# Patient Record
Sex: Female | Born: 1945
Health system: Southern US, Community
[De-identification: ages and names within clinical notes are randomized; demographics above are authoritative.]

## PROBLEM LIST (undated history)

## (undated) DIAGNOSIS — K219 Gastro-esophageal reflux disease without esophagitis: Secondary | ICD-10-CM

## (undated) DIAGNOSIS — H409 Unspecified glaucoma: Secondary | ICD-10-CM

## (undated) DIAGNOSIS — C801 Malignant (primary) neoplasm, unspecified: Secondary | ICD-10-CM

## (undated) DIAGNOSIS — D72820 Lymphocytosis (symptomatic): Secondary | ICD-10-CM

## (undated) DIAGNOSIS — F329 Major depressive disorder, single episode, unspecified: Secondary | ICD-10-CM

## (undated) DIAGNOSIS — F32A Depression, unspecified: Secondary | ICD-10-CM

## (undated) DIAGNOSIS — M792 Neuralgia and neuritis, unspecified: Secondary | ICD-10-CM

## (undated) DIAGNOSIS — C9110 Chronic lymphocytic leukemia of B-cell type not having achieved remission: Secondary | ICD-10-CM

## (undated) DIAGNOSIS — F419 Anxiety disorder, unspecified: Secondary | ICD-10-CM

## (undated) DIAGNOSIS — I10 Essential (primary) hypertension: Secondary | ICD-10-CM

## (undated) HISTORY — PX: GLAUCOMA SURGERY: SHX656

## (undated) HISTORY — PX: BREAST SURGERY: SHX581

---

## 2017-07-20 ENCOUNTER — Other Ambulatory Visit: Payer: Self-pay | Admitting: Orthopedic Surgery

## 2017-08-04 ENCOUNTER — Encounter (HOSPITAL_BASED_OUTPATIENT_CLINIC_OR_DEPARTMENT_OTHER): Payer: Self-pay | Admitting: *Deleted

## 2017-08-05 ENCOUNTER — Encounter (HOSPITAL_BASED_OUTPATIENT_CLINIC_OR_DEPARTMENT_OTHER)
Admission: RE | Admit: 2017-08-05 | Discharge: 2017-08-05 | Disposition: A | Discharge status: Home / Self Care | Payer: Self-pay | Source: Ambulatory Visit | Attending: Orthopedic Surgery | Admitting: Orthopedic Surgery

## 2017-08-05 DIAGNOSIS — Z888 Allergy status to other drugs, medicaments and biological substances status: Secondary | ICD-10-CM | POA: Diagnosis not present

## 2017-08-05 DIAGNOSIS — K219 Gastro-esophageal reflux disease without esophagitis: Secondary | ICD-10-CM | POA: Diagnosis not present

## 2017-08-05 DIAGNOSIS — F419 Anxiety disorder, unspecified: Secondary | ICD-10-CM | POA: Diagnosis not present

## 2017-08-05 DIAGNOSIS — Z853 Personal history of malignant neoplasm of breast: Secondary | ICD-10-CM | POA: Diagnosis not present

## 2017-08-05 DIAGNOSIS — M2021 Hallux rigidus, right foot: Secondary | ICD-10-CM | POA: Diagnosis present

## 2017-08-05 DIAGNOSIS — H409 Unspecified glaucoma: Secondary | ICD-10-CM | POA: Diagnosis not present

## 2017-08-05 DIAGNOSIS — M2031 Hallux varus (acquired), right foot: Secondary | ICD-10-CM | POA: Diagnosis not present

## 2017-08-05 DIAGNOSIS — I10 Essential (primary) hypertension: Secondary | ICD-10-CM | POA: Diagnosis not present

## 2017-08-05 DIAGNOSIS — Z79899 Other long term (current) drug therapy: Secondary | ICD-10-CM | POA: Diagnosis not present

## 2017-08-05 DIAGNOSIS — F329 Major depressive disorder, single episode, unspecified: Secondary | ICD-10-CM | POA: Diagnosis not present

## 2017-08-05 DIAGNOSIS — Z856 Personal history of leukemia: Secondary | ICD-10-CM | POA: Diagnosis not present

## 2017-08-05 DIAGNOSIS — M21621 Bunionette of right foot: Secondary | ICD-10-CM | POA: Diagnosis not present

## 2017-08-05 LAB — BASIC METABOLIC PANEL
ANION GAP: 8 (ref 5–15)
BUN: 9 mg/dL (ref 6–20)
CHLORIDE: 98 mmol/L — AB (ref 101–111)
CO2: 28 mmol/L (ref 22–32)
CREATININE: 0.67 mg/dL (ref 0.44–1.00)
Calcium: 10 mg/dL (ref 8.9–10.3)
GFR calc non Af Amer: >60 mL/min (ref >60)
GLUCOSE: 108 mg/dL — AB (ref 65–99)
Potassium: 4.1 mmol/L (ref 3.5–5.1)
Sodium: 134 mmol/L — ABNORMAL LOW (ref 135–145)

## 2017-08-11 ENCOUNTER — Ambulatory Visit (HOSPITAL_BASED_OUTPATIENT_CLINIC_OR_DEPARTMENT_OTHER): Payer: Medicare Other | Admitting: Certified Registered"

## 2017-08-11 ENCOUNTER — Encounter (HOSPITAL_BASED_OUTPATIENT_CLINIC_OR_DEPARTMENT_OTHER)
Admission: RE | Disposition: A | Discharge status: Home / Self Care | Payer: Self-pay | Source: Ambulatory Visit | Attending: Orthopedic Surgery

## 2017-08-11 ENCOUNTER — Encounter (HOSPITAL_BASED_OUTPATIENT_CLINIC_OR_DEPARTMENT_OTHER): Payer: Self-pay | Admitting: Certified Registered"

## 2017-08-11 ENCOUNTER — Ambulatory Visit (HOSPITAL_BASED_OUTPATIENT_CLINIC_OR_DEPARTMENT_OTHER)
Admission: RE | Admit: 2017-08-11 | Discharge: 2017-08-11 | Disposition: A | Discharge status: Home / Self Care | Payer: Medicare Other | Source: Ambulatory Visit | Attending: Orthopedic Surgery | Admitting: Orthopedic Surgery

## 2017-08-11 DIAGNOSIS — M2031 Hallux varus (acquired), right foot: Secondary | ICD-10-CM | POA: Insufficient documentation

## 2017-08-11 DIAGNOSIS — F329 Major depressive disorder, single episode, unspecified: Secondary | ICD-10-CM | POA: Insufficient documentation

## 2017-08-11 DIAGNOSIS — I10 Essential (primary) hypertension: Secondary | ICD-10-CM | POA: Insufficient documentation

## 2017-08-11 DIAGNOSIS — H409 Unspecified glaucoma: Secondary | ICD-10-CM | POA: Insufficient documentation

## 2017-08-11 DIAGNOSIS — M21621 Bunionette of right foot: Secondary | ICD-10-CM | POA: Insufficient documentation

## 2017-08-11 DIAGNOSIS — F419 Anxiety disorder, unspecified: Secondary | ICD-10-CM | POA: Insufficient documentation

## 2017-08-11 DIAGNOSIS — K219 Gastro-esophageal reflux disease without esophagitis: Secondary | ICD-10-CM | POA: Insufficient documentation

## 2017-08-11 DIAGNOSIS — Z856 Personal history of leukemia: Secondary | ICD-10-CM | POA: Insufficient documentation

## 2017-08-11 DIAGNOSIS — M2021 Hallux rigidus, right foot: Secondary | ICD-10-CM | POA: Diagnosis not present

## 2017-08-11 DIAGNOSIS — Z79899 Other long term (current) drug therapy: Secondary | ICD-10-CM | POA: Insufficient documentation

## 2017-08-11 DIAGNOSIS — Z853 Personal history of malignant neoplasm of breast: Secondary | ICD-10-CM | POA: Insufficient documentation

## 2017-08-11 DIAGNOSIS — Z888 Allergy status to other drugs, medicaments and biological substances status: Secondary | ICD-10-CM | POA: Insufficient documentation

## 2017-08-11 HISTORY — DX: Depression, unspecified: F32.A

## 2017-08-11 HISTORY — DX: Neuralgia and neuritis, unspecified: M79.2

## 2017-08-11 HISTORY — DX: Malignant (primary) neoplasm, unspecified: C80.1

## 2017-08-11 HISTORY — DX: Gastro-esophageal reflux disease without esophagitis: K21.9

## 2017-08-11 HISTORY — DX: Unspecified glaucoma: H40.9

## 2017-08-11 HISTORY — DX: Major depressive disorder, single episode, unspecified: F32.9

## 2017-08-11 HISTORY — DX: Essential (primary) hypertension: I10

## 2017-08-11 HISTORY — DX: Anxiety disorder, unspecified: F41.9

## 2017-08-11 HISTORY — PX: ARTHRODESIS METATARSALPHALANGEAL JOINT (MTPJ): SHX6566

## 2017-08-11 HISTORY — DX: Chronic lymphocytic leukemia of B-cell type not having achieved remission: C91.10

## 2017-08-11 HISTORY — DX: Lymphocytosis (symptomatic): D72.820

## 2017-08-11 SURGERY — FUSION, JOINT, GREAT TOE
Anesthesia: General | Site: Foot | Laterality: Right

## 2017-08-11 MED ORDER — MIDAZOLAM HCL 2 MG/2ML IJ SOLN
INTRAMUSCULAR | Status: AC
  Filled 2017-08-11: qty 2

## 2017-08-11 MED ORDER — BUPIVACAINE-EPINEPHRINE (PF) 0.5% -1:200000 IJ SOLN
INTRAMUSCULAR | Status: DC | PRN
  Administered 2017-08-11: 20 mL
  Administered 2017-08-11: 20 mL via PERINEURAL

## 2017-08-11 MED ORDER — PROPOFOL 10 MG/ML IV BOLUS
INTRAVENOUS | Status: DC | PRN
  Administered 2017-08-11: 150 mg via INTRAVENOUS

## 2017-08-11 MED ORDER — METOCLOPRAMIDE HCL 5 MG/ML IJ SOLN: 10.0000 mg | Freq: Once | INTRAMUSCULAR | Status: DC | PRN

## 2017-08-11 MED ORDER — FENTANYL CITRATE (PF) 100 MCG/2ML IJ SOLN
25.0000 ug | INTRAMUSCULAR | Status: DC | PRN
  Administered 2017-08-11 (×2): 50 ug via INTRAVENOUS

## 2017-08-11 MED ORDER — ONDANSETRON HCL 4 MG/2ML IJ SOLN
INTRAMUSCULAR | Status: DC | PRN
  Administered 2017-08-11: 4 mg via INTRAVENOUS

## 2017-08-11 MED ORDER — CEFAZOLIN SODIUM-DEXTROSE 2-4 GM/100ML-% IV SOLN
2.0000 g | INTRAVENOUS | Status: AC
  Administered 2017-08-11: 2 g via INTRAVENOUS

## 2017-08-11 MED ORDER — OXYCODONE HCL 5 MG PO TABS
5.0000 mg | ORAL_TABLET | ORAL | Status: DC | PRN
  Administered 2017-08-11: 5 mg via ORAL

## 2017-08-11 MED ORDER — SCOPOLAMINE 1 MG/3DAYS TD PT72: 1.0000 | MEDICATED_PATCH | Freq: Once | TRANSDERMAL | Status: DC | PRN

## 2017-08-11 MED ORDER — LIDOCAINE HCL (CARDIAC) 20 MG/ML IV SOLN
INTRAVENOUS | Status: DC | PRN
  Administered 2017-08-11: 30 mg via INTRAVENOUS

## 2017-08-11 MED ORDER — CHLORHEXIDINE GLUCONATE 4 % EX LIQD: 60.0000 mL | Freq: Once | CUTANEOUS | Status: DC

## 2017-08-11 MED ORDER — LACTATED RINGERS IV SOLN
INTRAVENOUS | Status: DC
  Administered 2017-08-11: 10:00:00 via INTRAVENOUS

## 2017-08-11 MED ORDER — SODIUM CHLORIDE 0.9 % IV SOLN: INTRAVENOUS | Status: DC

## 2017-08-11 MED ORDER — MEPERIDINE HCL 25 MG/ML IJ SOLN: 6.2500 mg | INTRAMUSCULAR | Status: DC | PRN

## 2017-08-11 MED ORDER — MIDAZOLAM HCL 2 MG/2ML IJ SOLN: 1.0000 mg | INTRAMUSCULAR | Status: DC | PRN

## 2017-08-11 MED ORDER — DEXAMETHASONE SODIUM PHOSPHATE 10 MG/ML IJ SOLN
INTRAMUSCULAR | Status: DC | PRN
  Administered 2017-08-11: 10 mg via INTRAVENOUS

## 2017-08-11 MED ORDER — FENTANYL CITRATE (PF) 100 MCG/2ML IJ SOLN
INTRAMUSCULAR | Status: AC
  Filled 2017-08-11: qty 2

## 2017-08-11 MED ORDER — CEFAZOLIN SODIUM-DEXTROSE 2-4 GM/100ML-% IV SOLN
INTRAVENOUS | Status: AC
  Filled 2017-08-11: qty 100

## 2017-08-11 MED ORDER — OXYCODONE HCL 5 MG PO TABS
ORAL_TABLET | ORAL | Status: AC
  Filled 2017-08-11: qty 1

## 2017-08-11 MED ORDER — FENTANYL CITRATE (PF) 100 MCG/2ML IJ SOLN
50.0000 ug | INTRAMUSCULAR | Status: DC | PRN
  Administered 2017-08-11 (×2): 50 ug via INTRAVENOUS

## 2017-08-11 MED ORDER — 0.9 % SODIUM CHLORIDE (POUR BTL) OPTIME
TOPICAL | Status: DC | PRN
  Administered 2017-08-11: 200 mL

## 2017-08-11 SURGICAL SUPPLY — 76 items
BANDAGE ESMARK 6X9 LF (GAUZE/BANDAGES/DRESSINGS) IMPLANT
BIT DRILL 2.0 (BIT) ×2
BIT DRILL 2.0MM (BIT) ×1
BIT DRILL 2.9 CANN QC NONSTRL (BIT) ×2 IMPLANT
BIT DRILL 2XNS DISP SS SM FRAG (BIT) IMPLANT
BIT DRL 2XNS DISP SS SM FRAG (BIT) ×1
BLADE AVERAGE 25MMX9MM (BLADE) ×1
BLADE AVERAGE 25X9 (BLADE) ×1 IMPLANT
BLADE MICRO SAGITTAL (BLADE) IMPLANT
BLADE OSC/SAG .038X5.5 CUT EDG (BLADE) IMPLANT
BLADE SURG 15 STRL LF DISP TIS (BLADE) ×2 IMPLANT
BLADE SURG 15 STRL SS (BLADE) ×6
BNDG CMPR 9X6 STRL LF SNTH (GAUZE/BANDAGES/DRESSINGS)
BNDG COHESIVE 4X5 TAN STRL (GAUZE/BANDAGES/DRESSINGS) ×3 IMPLANT
BNDG COHESIVE 6X5 TAN STRL LF (GAUZE/BANDAGES/DRESSINGS) ×3 IMPLANT
BNDG ESMARK 6X9 LF (GAUZE/BANDAGES/DRESSINGS)
CHLORAPREP W/TINT 26ML (MISCELLANEOUS) ×3 IMPLANT
COVER BACK TABLE 60X90IN (DRAPES) ×5 IMPLANT
CUFF TOURNIQUET SINGLE 34IN LL (TOURNIQUET CUFF) ×2 IMPLANT
DRAPE EXTREMITY T 121X128X90 (DRAPE) ×3 IMPLANT
DRAPE OEC MINIVIEW 54X84 (DRAPES) ×3 IMPLANT
DRAPE U-SHAPE 47X51 STRL (DRAPES) ×3 IMPLANT
DRSG MEPITEL 4X7.2 (GAUZE/BANDAGES/DRESSINGS) ×3 IMPLANT
DRSG PAD ABDOMINAL 8X10 ST (GAUZE/BANDAGES/DRESSINGS) ×6 IMPLANT
ELECT REM PT RETURN 9FT ADLT (ELECTROSURGICAL) ×3
ELECTRODE REM PT RTRN 9FT ADLT (ELECTROSURGICAL) ×1 IMPLANT
GAUZE SPONGE 4X4 12PLY STRL (GAUZE/BANDAGES/DRESSINGS) ×3 IMPLANT
GLOVE BIO SURGEON STRL SZ8 (GLOVE) ×3 IMPLANT
GLOVE BIOGEL PI IND STRL 7.0 (GLOVE) IMPLANT
GLOVE BIOGEL PI IND STRL 8 (GLOVE) ×2 IMPLANT
GLOVE BIOGEL PI INDICATOR 7.0 (GLOVE) ×4
GLOVE BIOGEL PI INDICATOR 8 (GLOVE) ×4
GLOVE ECLIPSE 6.5 STRL STRAW (GLOVE) ×2 IMPLANT
GLOVE ECLIPSE 8.0 STRL XLNG CF (GLOVE) ×3 IMPLANT
GOWN STRL REUS W/ TWL LRG LVL3 (GOWN DISPOSABLE) ×1 IMPLANT
GOWN STRL REUS W/ TWL XL LVL3 (GOWN DISPOSABLE) ×2 IMPLANT
GOWN STRL REUS W/TWL LRG LVL3 (GOWN DISPOSABLE) ×3
GOWN STRL REUS W/TWL XL LVL3 (GOWN DISPOSABLE) ×6
K-WIRE ACE 1.6X6 (WIRE) ×6
KWIRE ACE 1.6X6 (WIRE) IMPLANT
NEEDLE HYPO 22GX1.5 SAFETY (NEEDLE) IMPLANT
PACK BASIN DAY SURGERY FS (CUSTOM PROCEDURE TRAY) ×3 IMPLANT
PAD CAST 4YDX4 CTTN HI CHSV (CAST SUPPLIES) ×1 IMPLANT
PADDING CAST ABS 4INX4YD NS (CAST SUPPLIES)
PADDING CAST ABS COTTON 4X4 ST (CAST SUPPLIES) IMPLANT
PADDING CAST COTTON 4X4 STRL (CAST SUPPLIES) ×3
PADDING CAST COTTON 6X4 STRL (CAST SUPPLIES) ×3 IMPLANT
PENCIL BUTTON HOLSTER BLD 10FT (ELECTRODE) ×3 IMPLANT
PLATE SM 1/4 TUBULAR 6H (Plate) ×2 IMPLANT
SANITIZER HAND PURELL 535ML FO (MISCELLANEOUS) ×3 IMPLANT
SCREW ACE CAN 4.0 42M (Screw) ×2 IMPLANT
SCREW CORTICAL 2.7MM  20MM (Screw) ×6 IMPLANT
SCREW CORTICAL 2.7MM 16MM (Screw) ×2 IMPLANT
SCREW CORTICAL 2.7MM 20MM (Screw) IMPLANT
SHEET MEDIUM DRAPE 40X70 STRL (DRAPES) ×3 IMPLANT
SLEEVE SCD COMPRESS KNEE MED (MISCELLANEOUS) ×3 IMPLANT
SPLINT FAST PLASTER 5X30 (CAST SUPPLIES) ×40
SPLINT PLASTER CAST FAST 5X30 (CAST SUPPLIES) ×20 IMPLANT
SPONGE LAP 18X18 X RAY DECT (DISPOSABLE) ×3 IMPLANT
SPONGE SURGIFOAM ABS GEL 12-7 (HEMOSTASIS) IMPLANT
STOCKINETTE 6  STRL (DRAPES) ×2
STOCKINETTE 6 STRL (DRAPES) ×1 IMPLANT
SUCTION FRAZIER HANDLE 10FR (MISCELLANEOUS) ×2
SUCTION TUBE FRAZIER 10FR DISP (MISCELLANEOUS) ×1 IMPLANT
SUT ETHILON 3 0 PS 1 (SUTURE) ×3 IMPLANT
SUT MNCRL AB 3-0 PS2 18 (SUTURE) ×3 IMPLANT
SUT VIC AB 0 SH 27 (SUTURE) IMPLANT
SUT VIC AB 2-0 SH 27 (SUTURE) ×3
SUT VIC AB 2-0 SH 27XBRD (SUTURE) IMPLANT
SYR BULB 3OZ (MISCELLANEOUS) ×3 IMPLANT
SYR CONTROL 10ML LL (SYRINGE) IMPLANT
TOWEL OR 17X24 6PK STRL BLUE (TOWEL DISPOSABLE) ×6 IMPLANT
TOWEL OR NON WOVEN STRL DISP B (DISPOSABLE) ×2 IMPLANT
TUBE CONNECTING 20'X1/4 (TUBING) ×1
TUBE CONNECTING 20X1/4 (TUBING) ×2 IMPLANT
UNDERPAD 30X30 (UNDERPADS AND DIAPERS) ×3 IMPLANT

## 2017-08-11 NOTE — Anesthesia Procedure Notes (Signed)
Procedure Name: LMA Insertion Date/Time: 08/11/2017 12:39 PM Performed by: Raysean Graumann D Pre-anesthesia Checklist: Patient identified, Emergency Drugs available, Suction available and Patient being monitored Patient Re-evaluated:Patient Re-evaluated prior to induction Oxygen Delivery Method: Circle system utilized Preoxygenation: Pre-oxygenation with 100% oxygen Induction Type: IV induction Ventilation: Mask ventilation without difficulty LMA: LMA inserted LMA Size: 3.0 Number of attempts: 1 Airway Equipment and Method: Bite block Placement Confirmation: positive ETCO2 Tube secured with: Tape Dental Injury: Teeth and Oropharynx as per pre-operative assessment

## 2017-08-11 NOTE — Op Note (Signed)
08/11/2017  1:50 PM  PATIENT:  Karen Bradford  71 y.o. female  PRE-OPERATIVE DIAGNOSIS: 1.  Right hallux rigidus      2.  Right foot bunionette  POST-OPERATIVE DIAGNOSIS: Same  Procedure(s): 1.  Right Hallux Metatarsal Phalangeal Joint Arthrodesis 2.  Excision of right foot Bunionette (separate incision) 3.  AP and lateral xrays of the right foot  SURGEON:  Wylene Simmer, MD  ASSISTANT: none  ANESTHESIA:   General, regional  EBL:  minimal   TOURNIQUET:   Total Tourniquet Time Documented: Thigh (Right) - 45 minutes Total: Thigh (Right) - 45 minutes  COMPLICATIONS:  None apparent  DISPOSITION:  Extubated, awake and stable to recovery.  INDICATION FOR PROCEDURE: The patient is a 71 year old woman who has a long history of right forefoot pain.  She has a bunionette deformity causing pain at the lateral aspect of the forefoot.  She has a hallux varus deformity with degenerative changes at the joint consistent with hallux rigidus.  She has failed nonoperative treatment to date and presents today for hallux MP joint arthrodesis and excision of her bunionette deformity.  She understands the risks and benefits of the alternative treatment options and elects surgical treatment.  She specifically understands risks of bleeding, infection, nerve damage, blood clots, continued pain, amputation and death.   PROCEDURE IN DETAIL: After preoperative consent was obtained and the correct operative site was identified, the patient was brought to the operating room and placed supine on the operating table.  General anesthesia was induced.  Surgical timeout was taken.  Preoperative antibiotics were administered.  The right lower extremity was then prepped and draped in standard sterile fashion with a tourniquet around the thigh.  The extremity was exsanguinated and the tourniquet was inflated to 250 mmHg.  A longitudinal incision was made over the hallux MP joint.  Dissection was carried down through the  skin and subcutaneous tissue.  The extensor hallucis longus and brevis tendons were identified.  They were mobilized and retracted laterally exposing the metatarsal head.  The joint capsule was incised and elevated medially and laterally.  The collateral ligaments were released, and the hallux was plantarflexed.  A K wire was inserted into the center of the metatarsal head.  A 22 mm concave reamer was used to remove the remaining articular cartilage and subchondral bone.  The K wire was removed and placed centrally in the base of the proximal phalanx.  A 22 mm convex reamer was used to remove the remaining articular cartilage and subchondral bone.  Wound was irrigated copiously.  A 2 mm drill bit was used to perforate the bone on both sides of the joint leaving the resultant bone graft in place.  The joint was reduced and provisionally pinned.  AP and lateral radiograph confirmed appropriate alignment of the toe.  Simulated weightbearing showed appropriate position of the toe as well.    The joint was then compressed with a 4 mm partially threaded cannulated screw from the [employer] small frag set.  A 6 hole one quarter tubular plate from the [employer] mini frag set was then contoured to fit the dorsum of the joint.  It was secured proximally and distally with 2 bicortical 2.7 millimeter screws.  AP and lateral radiographs confirmed appropriate position and length of all hardware in appropriate alignment of the hallux.  The wound was irrigated copiously.  Deep subtendinous tissues were approximated with Vicryl.  Superficial subtendinous tissues were approximated with Monocryl.  Skin was closed with a running 3-0  nylon.  Attention was then turned to the fifth metatarsal head laterally.  A longitudinal incision was made.  Dissection was carried down through the skin and subtendinous tissues.  Nerve branch was protected.  The joint capsule was incised and elevated. The bunionette deformity was then resected in line  with the metatarsal shaft using an oscillating saw.    Final AP and lateral radiographs of the right foot showed interval arthrodesis of the hallux MP joint with correction of the hallux varus deformity.  Interval resection of the bunionette deformity was also noted.  No acute injuries.  The lateral wound was then irrigated copiously.  Imbricating sutures of 2-0 Vicryl were used to close the joint capsule.  Subcutaneous tissues were approximated with Monocryl, and the skin was closed with a running 3-0 nylon suture.  Sterile dressings were applied followed by a well-padded short leg splint.  Tourniquet was released after application of the dressings at approximately 45 minutes.  The patient was then awakened from anesthesia and transported to the recovery room in stable condition.   FOLLOW UP PLAN: Nonweightbearing on the right lower extremity.  Follow-up with me in the office in 2 weeks for suture removal and conversion to a Cam Walker boot.   RADIOGRAPHS: AP and lateral radiographs of the right foot are obtained intraoperatively.  These show interval arthrodesis of the hallux MP joint with appropriate correction of the hallux varus deformity.  The bunionette deformity has been resected in the interim.  No other acute injuries are noted.

## 2017-08-11 NOTE — H&P (Signed)
Karen Bradford is an 71 y.o. female.   Chief Complaint: right forefoot pain HPI:  71 y/o female without signficnat PMH c/o chronic pain at the right forefoot from hallux rigidus and a bunionette.  She has failed non op treatment and presents now for hallux MPJ arthrodesis and excision of the bunionette.  Past Medical History:  Diagnosis Date  . Anxiety   . Cancer ([place])    breast  . Chronic lymphocytic leukemia ([place])   . Depression   . GERD (gastroesophageal reflux disease)   . Glaucoma   . Hypertension   . Lymphocytosis   . Polyneuropathic pain     Past Surgical History:  Procedure Laterality Date  . BREAST SURGERY     mastectomy  . GLAUCOMA SURGERY      History reviewed. No pertinent family history. Social History:  reports that she has never smoked. She has never used smokeless tobacco. She reports that she drinks about 1.2 oz of alcohol per week . She reports that she does not use drugs.  Allergies:  Allergies  Allergen Reactions  . Colesevelam Rash    Medications Prior to Admission  Medication Sig Dispense Refill  . ARIPiprazole (ABILIFY) 5 MG tablet Take 2 mg by mouth daily.     . cholecalciferol (VITAMIN D) 1000 units tablet Take 1,000 Units by mouth daily.    . clonazePAM (KLONOPIN) 1 MG tablet Take 1 mg by mouth 2 (two) times daily.    . dorzolamide-timolol (COSOPT) 22.3-6.8 MG/ML ophthalmic solution 1 drop 2 (two) times daily.    Marland Kitchen losartan (COZAAR) 100 MG tablet Take 100 mg by mouth daily.    . magnesium oxide (MAG-OX) 400 MG tablet Take 400 mg by mouth daily.    Marland Kitchen omeprazole (PRILOSEC) 40 MG capsule Take 40 mg by mouth daily.    Marland Kitchen oxybutynin (DITROPAN) 5 MG tablet Take 5 mg by mouth 3 (three) times daily.    . QUEtiapine (SEROQUEL) 100 MG tablet Take 25 mg by mouth at bedtime.     . sertraline (ZOLOFT) 100 MG tablet Take 200 mg by mouth daily.     . timolol (TIMOPTIC) 0.5 % ophthalmic solution 1 drop 2 (two) times daily.    Marland Kitchen zolpidem (AMBIEN) 5 MG tablet Take 5  mg by mouth at bedtime as needed for sleep.      No results found for this or any previous visit (from the past 48 hour(s)). No results found.  ROS  No recent f/c/n/v/wt loss  Blood pressure (!) 118/51, pulse 60, resp. rate 11, height 5\' 6"  (1.676 m), weight 74.8 kg (165 lb), SpO2 100 %. Physical Exam  wn wd woman in nad.  A and O x 4.  Mood anda ffect normal.  EOMI.  Res punlaboerd.  R forefoot with decreased ROM at the hallux MPJ.  No lymphadenopathy.  5/5 strength in PF and DF of the ankle and toes.  Sens to LT intact.  Brisk cap refill at the toes.  Assessment/Plan R hallux rigidus and bunionette.  To OR for surgical treatment.  The risks and benefits of the alternative treatment options have been discussed in detail.  The patient wishes to proceed with surgery and specifically understands risks of bleeding, infection, nerve damage, blood clots, need for additional surgery, amputation and death.   Wylene Simmer, MD 09/06/2017, 11:34 AM

## 2017-08-11 NOTE — Discharge Instructions (Addendum)
John Hewitt, MD °[facility] ° °Please read the following information regarding your care after surgery. ° °Medications  °You only need a prescription for the narcotic pain medicine (ex. oxycodone, Percocet, Norco).  All of the other medicines listed below are available over the counter. °X Aleve 2 pills twice a day for the first 3 days after surgery. °X acetominophen (Tylenol) 650 mg every 4-6 hours as you need for minor to moderate pain °X oxycodone as prescribed for severe pain ° °Narcotic pain medicine (ex. oxycodone, Percocet, Vicodin) will cause constipation.  To prevent this problem, take the following medicines while you are taking any pain medicine. °X docusate sodium (Colace) 100 mg twice a day X senna (Senokot) 2 tablets twice a day ° °X To help prevent blood clots, take a baby aspirin (81 mg) twice a day for two weeks after surgery.  You should also get up every hour while you are awake to move around.   ° °Weight Bearing °? Bear weight when you are able on your operated leg or foot. °? Bear weight only on your operated foot in the post-op shoe. °X Do not bear any weight on the operated leg or foot. ° °Cast / Splint / Dressing °X Keep your splint, cast or dressing clean and dry.  Don’t put anything (coat hanger, pencil, etc) down inside of it.  If it gets damp, use a hair dryer on the cool setting to dry it.  If it gets soaked, call the office to schedule an appointment for a cast change. °? Remove your dressing 3 days after surgery and cover the incisions with dry dressings.   ° °After your dressing, cast or splint is removed; you may shower, but do not soak or scrub the wound.  Allow the water to run over it, and then gently pat it dry. ° °Swelling °It is normal for you to have swelling where you had surgery.  To reduce swelling and pain, keep your toes above your nose for at least 3 days after surgery.  It may be necessary to keep your foot or leg elevated for several weeks.  If it hurts,  it should be elevated. ° °Follow Up °Call my office at [phone] when you are discharged from the hospital or surgery center to schedule an appointment to be seen two weeks after surgery. ° °Call my office at [phone] if you develop a fever >101.5° F, nausea, vomiting, bleeding from the surgical site or severe pain.   ° ° ° °Post Anesthesia Home Care Instructions ° °Activity: °Get plenty of rest for the remainder of the day. A responsible individual must stay with you for 24 hours following the procedure.  °For the next 24 hours, DO NOT: °-Drive a car °-Operate machinery °-Drink alcoholic beverages °-Take any medication unless instructed by your physician °-Make any legal decisions or sign important papers. ° °Meals: °Start with liquid foods such as gelatin or soup. Progress to regular foods as tolerated. Avoid greasy, spicy, heavy foods. If nausea and/or vomiting occur, drink only clear liquids until the nausea and/or vomiting subsides. Call your physician if vomiting continues. ° °Special Instructions/Symptoms: °Your throat may feel dry or sore from the anesthesia or the breathing tube placed in your throat during surgery. If this causes discomfort, gargle with warm salt water. The discomfort should disappear within 24 hours. ° °If you had a scopolamine patch placed behind your ear for the management of post- operative nausea and/or vomiting: ° °1. The medication in the   patch is effective for 72 hours, after which it should be removed.  Wrap patch in a tissue and discard in the trash. Wash hands thoroughly with soap and water. °2. You may remove the patch earlier than 72 hours if you experience unpleasant side effects which may include dry mouth, dizziness or visual disturbances. °3. Avoid touching the patch. Wash your hands with soap and water after contact with the patch. °  ° ° °Regional Anesthesia Blocks ° °1. Numbness or the inability to move the "blocked" extremity may last from 3-48 hours after  placement. The length of time depends on the medication injected and your individual response to the medication. If the numbness is not going away after 48 hours, call your surgeon. ° °2. The extremity that is blocked will need to be protected until the numbness is gone and the  Strength has returned. Because you cannot feel it, you will need to take extra care to avoid injury. Because it may be weak, you may have difficulty moving it or using it. You may not know what position it is in without looking at it while the block is in effect. ° °3. For blocks in the legs and feet, returning to weight bearing and walking needs to be done carefully. You will need to wait until the numbness is entirely gone and the strength has returned. You should be able to move your leg and foot normally before you try and bear weight or walk. You will need someone to be with you when you first try to ensure you do not fall and possibly risk injury. ° °4. Bruising and tenderness at the needle site are common side effects and will resolve in a few days. ° °5. Persistent numbness or new problems with movement should be communicated to the surgeon or the [facility] ([phone])/ [facility] ([phone]). °

## 2017-08-11 NOTE — Anesthesia Preprocedure Evaluation (Signed)
Anesthesia Evaluation  Patient identified by MRN, date of birth, ID band Patient awake    Reviewed: Allergy & Precautions, NPO status , Patient's Chart, lab work & pertinent test results  Airway Mallampati: II  TM Distance: >3 FB Neck ROM: Full    Dental no notable dental hx. (+) Caps   Pulmonary neg pulmonary ROS,    Pulmonary exam normal breath sounds clear to auscultation       Cardiovascular hypertension, Pt. on medications Normal cardiovascular exam Rhythm:Regular Rate:Normal     Neuro/Psych negative neurological ROS  negative psych ROS   GI/Hepatic Neg liver ROS, GERD  Medicated and Controlled,  Endo/Other  negative endocrine ROS  Renal/GU negative Renal ROS  negative genitourinary   Musculoskeletal negative musculoskeletal ROS (+)   Abdominal   Peds negative pediatric ROS (+)  Hematology CLL   Anesthesia Other Findings   Reproductive/Obstetrics negative OB ROS                             Anesthesia Physical Anesthesia Plan  ASA: II  Anesthesia Plan: General   Post-op Pain Management:  Regional for Post-op pain   Induction: Intravenous  PONV Risk Score and Plan: 3 and Ondansetron, Dexamethasone, Midazolam and Treatment may vary due to age or medical condition  Airway Management Planned: LMA  Additional Equipment:   Intra-op Plan:   Post-operative Plan: Extubation in OR  Informed Consent: I have reviewed the patients History and Physical, chart, labs and discussed the procedure including the risks, benefits and alternatives for the proposed anesthesia with the patient or authorized representative who has indicated his/her understanding and acceptance.   Dental advisory given  Plan Discussed with: CRNA  Anesthesia Plan Comments: (Adductor and Popliteal block)        Anesthesia Quick Evaluation

## 2017-08-11 NOTE — Progress Notes (Signed)
Assisted Dr. Carignan with right, ultrasound guided, popliteal/saphenous block. Side rails up, monitors on throughout procedure. See vital signs in flow sheet. Tolerated Procedure well. 

## 2017-08-11 NOTE — Transfer of Care (Signed)
Immediate Anesthesia Transfer of Care Note  Patient: Amori Cooperman  Procedure(s) Performed: Right Hallux Metatarsal Phalangeal Joint Arthrodesis; Excision of Bunionette (Right Foot)  Patient Location: PACU  Anesthesia Type:General  Level of Consciousness: awake, alert  and oriented  Airway & Oxygen Therapy: Patient Spontanous Breathing and Patient connected to face mask oxygen  Post-op Assessment: Report given to RN and Post -op Vital signs reviewed and stable  Post vital signs: Reviewed and stable  Last Vitals:  Vitals:   08/11/17 1111 08/11/17 1116  BP:  (!) 118/51  Pulse: 66 60  Resp: 18 11  SpO2: 100% 100%    Last Pain:  Vitals:   08/11/17 1015  TempSrc: Oral         Complications: No apparent anesthesia complications

## 2017-08-11 NOTE — Anesthesia Postprocedure Evaluation (Signed)
Anesthesia Post Note  Patient: Karen Bradford  Procedure(s) Performed: Right Hallux Metatarsal Phalangeal Joint Arthrodesis; Excision of Bunionette (Right Foot)     Patient location during evaluation: PACU Anesthesia Type: General Level of consciousness: awake and alert Pain management: pain level controlled Vital Signs Assessment: post-procedure vital signs reviewed and stable Respiratory status: spontaneous breathing, nonlabored ventilation, respiratory function stable and patient connected to nasal cannula oxygen Cardiovascular status: blood pressure returned to baseline and stable Postop Assessment: no apparent nausea or vomiting Anesthetic complications: no    Last Vitals:  Vitals:   08/11/17 1430 08/11/17 1524  BP: (!) 133/54 (!) 156/59  Pulse: 60 63  Resp: 12 18  Temp:  36.5 C  SpO2: 96% 97%    Last Pain:  Vitals:   08/11/17 1524  TempSrc:   PainSc: 4         RLE Motor Response: Non-purposeful movement (08/11/17 1524) RLE Sensation: Pain (08/11/17 1524)      Montez Hageman

## 2017-08-11 NOTE — Anesthesia Procedure Notes (Signed)
Anesthesia Regional Block: Adductor canal block   Pre-Anesthetic Checklist: ,, timeout performed, Correct Patient, Correct Site, Correct Laterality, Correct Procedure, Correct Position, site marked, Risks and benefits discussed,  Surgical consent,  Pre-op evaluation,  At surgeon's request and post-op pain management  Laterality: Right and Lower  Prep: Maximum Sterile Barrier Precautions used, chloraprep       Needles:  Injection technique: Single-shot  Needle Type: Echogenic Stimulator Needle     Needle Length: 10cm      Additional Needles:   Procedures:,,,, ultrasound used (permanent image in chart),,,,  Narrative:  Start time: 08/11/2017 11:11 AM End time: 08/11/2017 11:16 AM Injection made incrementally with aspirations every 5 mL.  Performed by: Personally  Anesthesiologist: Montez Hageman  Additional Notes: Risks, benefits and alternative to block explained extensively.  Patient tolerated procedure well, without complications.

## 2017-08-11 NOTE — Anesthesia Procedure Notes (Signed)
Anesthesia Regional Block: Popliteal block   Pre-Anesthetic Checklist: ,, timeout performed, Correct Patient, Correct Site, Correct Laterality, Correct Procedure, Correct Position, site marked, Risks and benefits discussed,  Surgical consent,  Pre-op evaluation,  At surgeon's request and post-op pain management  Laterality: Right and Lower  Prep: Maximum Sterile Barrier Precautions used, chloraprep       Needles:  Injection technique: Single-shot  Needle Type: Echogenic Stimulator Needle     Needle Length: 10cm      Additional Needles:   Procedures:,,,, ultrasound used (permanent image in chart),,,,  Narrative:  Start time: 08/11/2017 11:16 AM End time: 08/11/2017 11:21 AM Injection made incrementally with aspirations every 5 mL.  Performed by: Personally  Anesthesiologist: Montez Hageman  Additional Notes: Risks, benefits and alternative to block explained extensively.  Patient tolerated procedure well, without complications.

## 2017-08-12 ENCOUNTER — Encounter (HOSPITAL_BASED_OUTPATIENT_CLINIC_OR_DEPARTMENT_OTHER): Payer: Self-pay | Admitting: Orthopedic Surgery

## 2018-05-03 ENCOUNTER — Other Ambulatory Visit: Payer: Self-pay | Admitting: Student

## 2018-05-03 DIAGNOSIS — M2022 Hallux rigidus, left foot: Secondary | ICD-10-CM

## 2018-05-19 ENCOUNTER — Ambulatory Visit
Admission: RE | Admit: 2018-05-19 | Discharge: 2018-05-19 | Disposition: A | Discharge status: Home / Self Care | Payer: Medicare Other | Source: Ambulatory Visit | Attending: Student | Admitting: Student

## 2018-05-19 ENCOUNTER — Other Ambulatory Visit: Payer: Self-pay | Admitting: Student

## 2018-05-19 DIAGNOSIS — M2022 Hallux rigidus, left foot: Secondary | ICD-10-CM

## 2018-05-25 ENCOUNTER — Other Ambulatory Visit: Payer: Self-pay | Admitting: Student

## 2018-05-25 DIAGNOSIS — M2021 Hallux rigidus, right foot: Secondary | ICD-10-CM

## 2019-10-13 ENCOUNTER — Other Ambulatory Visit: Payer: Self-pay

## 2019-10-13 ENCOUNTER — Emergency Department (HOSPITAL_BASED_OUTPATIENT_CLINIC_OR_DEPARTMENT_OTHER): Payer: Medicare Other

## 2019-10-13 ENCOUNTER — Inpatient Hospital Stay (HOSPITAL_BASED_OUTPATIENT_CLINIC_OR_DEPARTMENT_OTHER)
Admission: EM | Admit: 2019-10-13 | Discharge: 2019-10-15 | DRG: 069 | Disposition: A | Discharge status: Home / Self Care | Payer: Medicare Other | Attending: Family Medicine | Admitting: Family Medicine

## 2019-10-13 ENCOUNTER — Encounter (HOSPITAL_BASED_OUTPATIENT_CLINIC_OR_DEPARTMENT_OTHER): Payer: Self-pay

## 2019-10-13 DIAGNOSIS — G8194 Hemiplegia, unspecified affecting left nondominant side: Secondary | ICD-10-CM | POA: Diagnosis not present

## 2019-10-13 DIAGNOSIS — R29810 Facial weakness: Secondary | ICD-10-CM | POA: Diagnosis not present

## 2019-10-13 DIAGNOSIS — R29700 NIHSS score 0: Secondary | ICD-10-CM | POA: Diagnosis not present

## 2019-10-13 DIAGNOSIS — Z20828 Contact with and (suspected) exposure to other viral communicable diseases: Secondary | ICD-10-CM | POA: Diagnosis present

## 2019-10-13 DIAGNOSIS — K219 Gastro-esophageal reflux disease without esophagitis: Secondary | ICD-10-CM | POA: Diagnosis present

## 2019-10-13 DIAGNOSIS — G459 Transient cerebral ischemic attack, unspecified: Principal | ICD-10-CM | POA: Diagnosis present

## 2019-10-13 DIAGNOSIS — Z853 Personal history of malignant neoplasm of breast: Secondary | ICD-10-CM | POA: Diagnosis not present

## 2019-10-13 DIAGNOSIS — I10 Essential (primary) hypertension: Secondary | ICD-10-CM | POA: Diagnosis not present

## 2019-10-13 DIAGNOSIS — R55 Syncope and collapse: Secondary | ICD-10-CM

## 2019-10-13 DIAGNOSIS — F329 Major depressive disorder, single episode, unspecified: Secondary | ICD-10-CM | POA: Diagnosis present

## 2019-10-13 DIAGNOSIS — E876 Hypokalemia: Secondary | ICD-10-CM | POA: Diagnosis not present

## 2019-10-13 DIAGNOSIS — Z79899 Other long term (current) drug therapy: Secondary | ICD-10-CM | POA: Diagnosis not present

## 2019-10-13 DIAGNOSIS — C9110 Chronic lymphocytic leukemia of B-cell type not having achieved remission: Secondary | ICD-10-CM | POA: Diagnosis not present

## 2019-10-13 DIAGNOSIS — G629 Polyneuropathy, unspecified: Secondary | ICD-10-CM | POA: Diagnosis present

## 2019-10-13 DIAGNOSIS — E785 Hyperlipidemia, unspecified: Secondary | ICD-10-CM | POA: Diagnosis present

## 2019-10-13 DIAGNOSIS — M4802 Spinal stenosis, cervical region: Secondary | ICD-10-CM | POA: Diagnosis not present

## 2019-10-13 DIAGNOSIS — I480 Paroxysmal atrial fibrillation: Secondary | ICD-10-CM | POA: Diagnosis not present

## 2019-10-13 DIAGNOSIS — E871 Hypo-osmolality and hyponatremia: Secondary | ICD-10-CM

## 2019-10-13 DIAGNOSIS — D72829 Elevated white blood cell count, unspecified: Secondary | ICD-10-CM

## 2019-10-13 DIAGNOSIS — Z823 Family history of stroke: Secondary | ICD-10-CM | POA: Diagnosis not present

## 2019-10-13 DIAGNOSIS — I4891 Unspecified atrial fibrillation: Secondary | ICD-10-CM

## 2019-10-13 LAB — PROTIME-INR
INR: 1.1 (ref 0.8–1.2)
Prothrombin Time: 13.8 seconds (ref 11.4–15.2)

## 2019-10-13 LAB — URINALYSIS, ROUTINE W REFLEX MICROSCOPIC
Bilirubin Urine: NEGATIVE
Glucose, UA: NEGATIVE mg/dL
Ketones, ur: NEGATIVE mg/dL
Leukocytes,Ua: NEGATIVE
Nitrite: NEGATIVE
Protein, ur: 100 mg/dL — AB
Specific Gravity, Urine: <1.005 — ABNORMAL LOW (ref 1.005–1.030)
pH: 7 (ref 5.0–8.0)

## 2019-10-13 LAB — DIFFERENTIAL
Abs Immature Granulocytes: 0.07 10*3/uL (ref 0.00–0.07)
Basophils Absolute: 0.1 10*3/uL (ref 0.0–0.1)
Basophils Relative: 1 %
Eosinophils Absolute: 0.2 10*3/uL (ref 0.0–0.5)
Eosinophils Relative: 1 %
Immature Granulocytes: 0 %
Lymphocytes Relative: 57 %
Lymphs Abs: 12.9 10*3/uL — ABNORMAL HIGH (ref 0.7–4.0)
Monocytes Absolute: 1.4 10*3/uL — ABNORMAL HIGH (ref 0.1–1.0)
Monocytes Relative: 7 %
Neutro Abs: 7.5 10*3/uL (ref 1.7–7.7)
Neutrophils Relative %: 34 %
Smear Review: NORMAL

## 2019-10-13 LAB — BASIC METABOLIC PANEL
Anion gap: 11 (ref 5–15)
BUN: 13 mg/dL (ref 8–23)
CO2: 23 mmol/L (ref 22–32)
Calcium: 10.1 mg/dL (ref 8.9–10.3)
Chloride: 95 mmol/L — ABNORMAL LOW (ref 98–111)
Creatinine, Ser: 0.82 mg/dL (ref 0.44–1.00)
GFR calc Af Amer: >60 mL/min (ref >60)
GFR calc non Af Amer: >60 mL/min (ref >60)
Glucose, Bld: 159 mg/dL — ABNORMAL HIGH (ref 70–99)
Potassium: 3.4 mmol/L — ABNORMAL LOW (ref 3.5–5.1)
Sodium: 129 mmol/L — ABNORMAL LOW (ref 135–145)

## 2019-10-13 LAB — URINALYSIS, MICROSCOPIC (REFLEX): Bacteria, UA: NONE SEEN

## 2019-10-13 LAB — CBC
HCT: 39.9 % (ref 36.0–46.0)
Hemoglobin: 13.1 g/dL (ref 12.0–15.0)
MCH: 29.7 pg (ref 26.0–34.0)
MCHC: 32.8 g/dL (ref 30.0–36.0)
MCV: 90.5 fL (ref 80.0–100.0)
Platelets: 411 10*3/uL — ABNORMAL HIGH (ref 150–400)
RBC: 4.41 MIL/uL (ref 3.87–5.11)
RDW: 12.8 % (ref 11.5–15.5)
WBC: 22.2 10*3/uL — ABNORMAL HIGH (ref 4.0–10.5)
nRBC: 0.2 % (ref 0.0–0.2)

## 2019-10-13 LAB — CBG MONITORING, ED: Glucose-Capillary: 137 mg/dL — ABNORMAL HIGH (ref 70–99)

## 2019-10-13 LAB — RAPID URINE DRUG SCREEN, HOSP PERFORMED
Amphetamines: NOT DETECTED
Barbiturates: NOT DETECTED
Benzodiazepines: NOT DETECTED
Cocaine: NOT DETECTED
Opiates: NOT DETECTED
Tetrahydrocannabinol: NOT DETECTED

## 2019-10-13 LAB — SARS CORONAVIRUS 2 AG (30 MIN TAT): SARS Coronavirus 2 Ag: NEGATIVE

## 2019-10-13 LAB — TROPONIN I (HIGH SENSITIVITY): Troponin I (High Sensitivity): 12 ng/L (ref <18)

## 2019-10-13 LAB — ETHANOL: Alcohol, Ethyl (B): <10 mg/dL (ref <10)

## 2019-10-13 LAB — APTT: aPTT: 27 seconds (ref 24–36)

## 2019-10-13 MED ORDER — TIMOLOL MALEATE 0.5 % OP SOLN
1.0000 [drp] | Freq: Two times a day (BID) | OPHTHALMIC | Status: DC
  Filled 2019-10-13: qty 5

## 2019-10-13 MED ORDER — ZOLPIDEM TARTRATE 5 MG PO TABS
5.0000 mg | ORAL_TABLET | Freq: Every evening | ORAL | Status: DC | PRN
  Administered 2019-10-14: 5 mg via ORAL
  Filled 2019-10-13 (×2): qty 1

## 2019-10-13 MED ORDER — ARIPIPRAZOLE 2 MG PO TABS
2.0000 mg | ORAL_TABLET | Freq: Every day | ORAL | Status: DC
  Filled 2019-10-13: qty 1

## 2019-10-13 MED ORDER — IOHEXOL 350 MG/ML SOLN
100.0000 mL | Freq: Once | INTRAVENOUS | Status: AC | PRN
  Administered 2019-10-13: 100 mL via INTRAVENOUS

## 2019-10-13 MED ORDER — SODIUM CHLORIDE 0.9 % IV BOLUS
1000.0000 mL | Freq: Once | INTRAVENOUS | Status: AC
  Administered 2019-10-13: 1000 mL via INTRAVENOUS

## 2019-10-13 MED ORDER — DORZOLAMIDE HCL-TIMOLOL MAL 2-0.5 % OP SOLN
1.0000 [drp] | Freq: Two times a day (BID) | OPHTHALMIC | Status: DC
  Filled 2019-10-13: qty 10

## 2019-10-13 MED ORDER — SERTRALINE HCL 100 MG PO TABS: 200.0000 mg | ORAL_TABLET | Freq: Every day | ORAL | Status: DC

## 2019-10-13 MED ORDER — QUETIAPINE FUMARATE 25 MG PO TABS
25.0000 mg | ORAL_TABLET | Freq: Every day | ORAL | Status: DC
  Administered 2019-10-14: 25 mg via ORAL
  Filled 2019-10-13: qty 1

## 2019-10-13 MED ORDER — PANTOPRAZOLE SODIUM 40 MG PO TBEC: 80.0000 mg | DELAYED_RELEASE_TABLET | Freq: Every day | ORAL | Status: DC

## 2019-10-13 NOTE — ED Triage Notes (Signed)
Pt was sitting at the dinner table and suddenly became dizzy and had a syncopal episode. Per family pt had a few episodes of syncope. Pt states she is feeling a bit better at this time.

## 2019-10-13 NOTE — ED Notes (Signed)
X-ray at bedside

## 2019-10-13 NOTE — ED Notes (Signed)
Returned from CT scan.

## 2019-10-13 NOTE — ED Notes (Signed)
Pt in CT scan. Unable to contact [facility] via cart. Spoke with them via phone  [phone] and states will activate consult

## 2019-10-13 NOTE — ED Notes (Signed)
ED Provider at bedside. 

## 2019-10-13 NOTE — ED Notes (Signed)
Family in the car. Call [phone] for updates and for EDP to talk to family.

## 2019-10-13 NOTE — ED Provider Notes (Signed)
[facility] EMERGENCY DEPARTMENT Provider Note   CSN: [id number] Arrival date & time: 10/13/19  2140     History Chief Complaint  Patient presents with  . Loss of Consciousness    Karen Bradford is a 73 y.o. female with a history of hypertension, high cholesterol, CLL, presenting to the ED with loss of consciousness and slurred speech.  History is provided by the patient as well as her husband at the bedside.  They reportedly were having a normal dinner today with some friends.  The patient was sitting at the dinner table.  Around 8:45 PM, she reported she began to feel lightheaded and have tunnel vision and is feeling like there were fluorescent lights in her vision.  She then lost consciousness.  Her husband reports that she simply slumped back in her chair and was staring with eyes open.  There was no shaking activity noted.  He believes lasted approximately few minutes, after which she was groggy and seemed to wake up.  She states this never happened before.  She says she feels back to baseline.  She denies any headache, chest palpitations, shortness of breath.  She denies any vision changes.  Her husband is unsure whether her speech sounds off, is unsure whether she may have a mild left-sided facial droop.  He believes this may be normal for her.  Patient reports had a single vodka tonic with dinner.  She reports no known history of cardiac disease or atrial fibrillation that she is aware of.  HPI     Past Medical History:  Diagnosis Date  . Anxiety   . Cancer ([place])    breast  . Chronic lymphocytic leukemia ([place])   . Depression   . GERD (gastroesophageal reflux disease)   . Glaucoma   . Hypertension   . Lymphocytosis   . Polyneuropathic pain     Patient Active Problem List   Diagnosis Date Noted  . TIA (transient ischemic attack) 10/13/2019    Past Surgical History:  Procedure Laterality Date  . ARTHRODESIS METATARSALPHALANGEAL JOINT (MTPJ) Right  08/11/2017   Procedure: Right Hallux Metatarsal Phalangeal Joint Arthrodesis; Excision of Bunionette;  Surgeon: Wylene Simmer, MD;  Location: [facility];  Service: Orthopedics;  Laterality: Right;  . BREAST SURGERY     mastectomy  . GLAUCOMA SURGERY       OB History   No obstetric history on file.     No family history on file.  Social History   Tobacco Use  . Smoking status: Never Smoker  . Smokeless tobacco: Never Used  Substance Use Topics  . Alcohol use: Yes    Alcohol/week: 2.0 standard drinks    Types: 2 Cans of beer per week  . Drug use: No    Home Medications Prior to Admission medications   Medication Sig Start Date End Date Taking? Authorizing Provider  ARIPiprazole (ABILIFY) 5 MG tablet Take 2 mg by mouth daily.     [provider]  cholecalciferol (VITAMIN D) 1000 units tablet Take 1,000 Units by mouth daily.    [provider]  clonazePAM (KLONOPIN) 1 MG tablet Take 1 mg by mouth 2 (two) times daily.    [provider]  dorzolamide-timolol (COSOPT) 22.3-6.8 MG/ML ophthalmic solution 1 drop 2 (two) times daily.    [provider]  losartan (COZAAR) 100 MG tablet Take 100 mg by mouth daily.    [provider]  magnesium oxide (MAG-OX) 400 MG tablet Take 400 mg by  mouth daily.    [provider]  omeprazole (PRILOSEC) 40 MG capsule Take 40 mg by mouth daily.    [provider]  oxybutynin (DITROPAN) 5 MG tablet Take 5 mg by mouth 3 (three) times daily.    [provider]  QUEtiapine (SEROQUEL) 100 MG tablet Take 25 mg by mouth at bedtime.     [provider]  sertraline (ZOLOFT) 100 MG tablet Take 200 mg by mouth daily.     [provider]  timolol (TIMOPTIC) 0.5 % ophthalmic solution 1 drop 2 (two) times daily.    [provider]  zolpidem (AMBIEN) 5 MG tablet Take 5 mg by mouth at bedtime as needed for sleep.    [provider]    Allergies     Colesevelam  Review of Systems   Review of Systems  Constitutional: Negative for chills and fever.  Eyes: Positive for visual disturbance. Negative for photophobia.  Respiratory: Negative for cough and shortness of breath.   Cardiovascular: Negative for chest pain and palpitations.  Gastrointestinal: Negative for abdominal pain, nausea and vomiting.  Musculoskeletal: Negative for arthralgias and back pain.  Skin: Negative for pallor and rash.  Neurological: Positive for dizziness, syncope, facial asymmetry and light-headedness. Negative for weakness and headaches.  Psychiatric/Behavioral: Negative for agitation and confusion.  All other systems reviewed and are negative.   Physical Exam Updated Vital Signs BP 126/80   Pulse 80   Temp 97.8 F (36.6 C) (Oral)   Resp (!) 22   Ht 5\' 6"  (1.676 m)   Wt 68 kg   SpO2 94%   BMI 24.21 kg/m   Physical Exam Vitals and nursing note reviewed.  Constitutional:      General: She is not in acute distress.    Appearance: She is well-developed.  HENT:     Head: Normocephalic and atraumatic.  Eyes:     General: No visual field deficit.    Conjunctiva/sclera: Conjunctivae normal.  Cardiovascular:     Rate and Rhythm: Normal rate and regular rhythm.     Pulses: Normal pulses.  Pulmonary:     Effort: Pulmonary effort is normal. No respiratory distress.     Breath sounds: Normal breath sounds.  Abdominal:     Palpations: Abdomen is soft.     Tenderness: There is no abdominal tenderness.  Musculoskeletal:     Cervical back: Neck supple.  Skin:    General: Skin is warm and dry.  Neurological:     General: No focal deficit present.     Mental Status: She is alert and oriented to person, place, and time.     GCS: GCS eye subscore is 4. GCS verbal subscore is 5. GCS motor subscore is 6.     Sensory: Sensation is intact.     Motor: Motor function is intact. No weakness.     Coordination: Finger-Nose-Finger Test abnormal.     Comments:  Left lower sided facial droop Speech is thick (husband reports this is baseline) NIHSS 3 (+1 facial droop, +2 bilateral limb ataxia)     ED Results / Procedures / Treatments   Labs (all labs ordered are listed, but only abnormal results are displayed) Labs Reviewed  BASIC METABOLIC PANEL - Abnormal; Notable for the following components:      Result Value   Sodium 129 (*)    Potassium 3.4 (*)    Chloride 95 (*)    Glucose, Bld 159 (*)    All other components within  normal limits  CBC - Abnormal; Notable for the following components:   WBC 22.2 (*)    Platelets 411 (*)    All other components within normal limits  URINALYSIS, ROUTINE W REFLEX MICROSCOPIC - Abnormal; Notable for the following components:   Color, Urine STRAW (*)    Specific Gravity, Urine <1.005 (*)    Hgb urine dipstick TRACE (*)    Protein, ur 100 (*)    All other components within normal limits  DIFFERENTIAL - Abnormal; Notable for the following components:   Lymphs Abs 12.9 (*)    Monocytes Absolute 1.4 (*)    All other components within normal limits  CBG MONITORING, ED - Abnormal; Notable for the following components:   Glucose-Capillary 137 (*)    All other components within normal limits  SARS CORONAVIRUS 2 AG (30 MIN TAT)  SARS CORONAVIRUS 2 (TAT 6-24 HRS)  ETHANOL  PROTIME-INR  APTT  RAPID URINE DRUG SCREEN, HOSP PERFORMED  URINALYSIS, MICROSCOPIC (REFLEX)  PATHOLOGIST SMEAR REVIEW  TROPONIN I (HIGH SENSITIVITY)  TROPONIN I (HIGH SENSITIVITY)    EKG EKG Interpretation  Date/Time:  Saturday October 13 2019 21:52:23 EST Ventricular Rate:  92 PR Interval:    QRS Duration: 95 QT Interval:  352 QTC Calculation: 436 R Axis:   -36 Text Interpretation: Atrial fibrillation LVH with secondary repolarization abnormality No significant changes from prior Possible anterior infaract, old No STEMI Confirmed by Octaviano Glow [id number]) on 10/13/2019 10:55:53 PM   Radiology CT Angio Head W or Wo  Contrast  Result Date: 10/13/2019 CLINICAL DATA:  Initial evaluation for acute dizziness, syncope. EXAM: CT ANGIOGRAPHY HEAD AND NECK TECHNIQUE: Multidetector CT imaging of the head and neck was performed using the standard protocol during bolus administration of intravenous contrast. Multiplanar CT image reconstructions and MIPs were obtained to evaluate the vascular anatomy. Carotid stenosis measurements (when applicable) are obtained utilizing NASCET criteria, using the distal internal carotid diameter as the denominator. CONTRAST:  13mL OMNIPAQUE IOHEXOL 350 MG/ML SOLN COMPARISON:  Prior head CT from earlier same day. FINDINGS: CTA NECK FINDINGS Aortic arch: Visualized aortic arch of normal caliber with normal branch pattern. Scattered atheromatous plaque noted about the arch itself. No hemodynamically significant stenosis seen about the origin the great vessels. Visualized subclavian arteries widely patent. Right carotid system: Right common carotid artery widely patent from its origin to the bifurcation without stenosis. Mild atheromatous irregularity about the right bifurcation without stenosis. Right ICA widely patent distally to the skull base without stenosis, dissection or occlusion. Left carotid system: Left CCA widely patent from its origin to the bifurcation without stenosis. Mild atheromatous irregularity about the left bifurcation without stenosis. Left ICA widely patent distally to the skull base without stenosis, dissection or occlusion. Vertebral arteries: Both vertebral arteries arise from subclavian arteries. Left vertebral artery slightly dominant, with a diffusely hypoplastic right vertebral artery. Focal plaque at the origin left vertebral artery with no more than mild stenosis. Vertebral arteries otherwise widely patent within the neck without stenosis dissection or occlusion. Skeleton: No acute osseous abnormality. No discrete osseous lesions. Moderate cervical spondylosis noted at C3-4  through C6-7. Other neck: No other acute soft tissue abnormality within the neck. No mass lesion or adenopathy. Few scattered subcentimeter hypodense thyroid nodules noted, for which no follow-up imaging recommended given size and patient age. Upper chest: Visualized upper chest demonstrates no acute finding. Review of the MIP images confirms the above findings CTA HEAD FINDINGS Anterior circulation: Petrous segments widely patent bilaterally. Mild atheromatous  change within the carotid siphons without significant stenosis. A1 segments patent bilaterally. Normal anterior communicating artery complex. Anterior cerebral arteries widely patent or distal aspects without stenosis. No M1 stenosis or occlusion. Normal MCA bifurcations. Distal MCA branches well perfused and symmetric. Posterior circulation: Vertebral arteries widely patent to the vertebrobasilar junction without stenosis. Posterior inferior cerebral arteries patent bilaterally. Basilar widely patent to its distal aspect. Superior cerebral arteries patent bilaterally. Left PCA primarily supplied via the basilar. Fetal type origin of the right PCA. Both PCAs widely patent to their distal aspects. Venous sinuses: Patent. Anatomic variants: Fetal type origin right PCA. No intracranial aneurysm. Review of the MIP images confirms the above findings IMPRESSION: 1. Negative CTA for emergent large vessel occlusion. 2. Mild atheromatous change about the aortic arch, carotid bifurcations, and carotid siphons without hemodynamically significant or correctable stenosis. Electronically Signed   By: Jeannine Boga M.D.   On: 10/13/2019 23:22   CT Angio Neck W and/or Wo Contrast  Result Date: 10/13/2019 CLINICAL DATA:  Initial evaluation for acute dizziness, syncope. EXAM: CT ANGIOGRAPHY HEAD AND NECK TECHNIQUE: Multidetector CT imaging of the head and neck was performed using the standard protocol during bolus administration of intravenous contrast. Multiplanar  CT image reconstructions and MIPs were obtained to evaluate the vascular anatomy. Carotid stenosis measurements (when applicable) are obtained utilizing NASCET criteria, using the distal internal carotid diameter as the denominator. CONTRAST:  180mL OMNIPAQUE IOHEXOL 350 MG/ML SOLN COMPARISON:  Prior head CT from earlier same day. FINDINGS: CTA NECK FINDINGS Aortic arch: Visualized aortic arch of normal caliber with normal branch pattern. Scattered atheromatous plaque noted about the arch itself. No hemodynamically significant stenosis seen about the origin the great vessels. Visualized subclavian arteries widely patent. Right carotid system: Right common carotid artery widely patent from its origin to the bifurcation without stenosis. Mild atheromatous irregularity about the right bifurcation without stenosis. Right ICA widely patent distally to the skull base without stenosis, dissection or occlusion. Left carotid system: Left CCA widely patent from its origin to the bifurcation without stenosis. Mild atheromatous irregularity about the left bifurcation without stenosis. Left ICA widely patent distally to the skull base without stenosis, dissection or occlusion. Vertebral arteries: Both vertebral arteries arise from subclavian arteries. Left vertebral artery slightly dominant, with a diffusely hypoplastic right vertebral artery. Focal plaque at the origin left vertebral artery with no more than mild stenosis. Vertebral arteries otherwise widely patent within the neck without stenosis dissection or occlusion. Skeleton: No acute osseous abnormality. No discrete osseous lesions. Moderate cervical spondylosis noted at C3-4 through C6-7. Other neck: No other acute soft tissue abnormality within the neck. No mass lesion or adenopathy. Few scattered subcentimeter hypodense thyroid nodules noted, for which no follow-up imaging recommended given size and patient age. Upper chest: Visualized upper chest demonstrates no  acute finding. Review of the MIP images confirms the above findings CTA HEAD FINDINGS Anterior circulation: Petrous segments widely patent bilaterally. Mild atheromatous change within the carotid siphons without significant stenosis. A1 segments patent bilaterally. Normal anterior communicating artery complex. Anterior cerebral arteries widely patent or distal aspects without stenosis. No M1 stenosis or occlusion. Normal MCA bifurcations. Distal MCA branches well perfused and symmetric. Posterior circulation: Vertebral arteries widely patent to the vertebrobasilar junction without stenosis. Posterior inferior cerebral arteries patent bilaterally. Basilar widely patent to its distal aspect. Superior cerebral arteries patent bilaterally. Left PCA primarily supplied via the basilar. Fetal type origin of the right PCA. Both PCAs widely patent to their distal aspects. Venous  sinuses: Patent. Anatomic variants: Fetal type origin right PCA. No intracranial aneurysm. Review of the MIP images confirms the above findings IMPRESSION: 1. Negative CTA for emergent large vessel occlusion. 2. Mild atheromatous change about the aortic arch, carotid bifurcations, and carotid siphons without hemodynamically significant or correctable stenosis. Electronically Signed   By: Jeannine Boga M.D.   On: 10/13/2019 23:22   DG Chest Portable 1 View  Result Date: 10/13/2019 CLINICAL DATA:  Infectious workup. Sudden onset of dizziness leading to syncope. EXAM: PORTABLE CHEST 1 VIEW COMPARISON:  Lung apices from neck CTA earlier this day. FINDINGS: Heart is normal in size. Aortic tortuosity, otherwise normal mediastinal contours. Aortic atherosclerosis. Pulmonary vasculature is normal. No consolidation, pleural effusion, or pneumothorax. No acute osseous abnormalities are seen. IMPRESSION: 1. No acute abnormality.  No evidence of intrathoracic infection. 2.  Aortic Atherosclerosis (ICD10-I70.0). Electronically Signed   By: Keith Rake M.D.   On: 10/13/2019 23:36   CT HEAD CODE STROKE WO CONTRAST  Result Date: 10/13/2019 CLINICAL DATA:  Code stroke. Initial evaluation for acute dizziness, syncope. EXAM: CT HEAD WITHOUT CONTRAST TECHNIQUE: Contiguous axial images were obtained from the base of the skull through the vertex without intravenous contrast. COMPARISON:  Prior head CT from 09/10/2005. FINDINGS: Brain: Generalized age-related cerebral atrophy with mild chronic small vessel ischemic disease. No acute intracranial hemorrhage. No acute large vessel territory infarct. No mass lesion, midline shift or mass effect. No hydrocephalus. No extra-axial fluid collection. Small dilated perivascular space noted at the inferior right basal ganglia. Vascular: No hyperdense vessel. Skull: Scalp soft tissues and calvarium within normal limits. Sinuses/Orbits: Globes orbital soft tissues normal. Paranasal sinuses mastoid air cells are clear. Other: None. ASPECTS [facility] Stroke Program Early CT Score) - Ganglionic level infarction (caudate, lentiform nuclei, internal capsule, insula, M1-M3 cortex): 7 - Supraganglionic infarction (M4-M6 cortex): 3 Total score (0-10 with 10 being normal): 10 IMPRESSION: 1. No acute intracranial infarct or other abnormality identified. 2. ASPECTS is 10. 3. Age-related cerebral atrophy with mild chronic small vessel ischemic disease. Critical Value/emergent results were called by telephone at the time of interpretation on 10/13/2019 at 10:42 pm to providerMATTHEW Zavia Pullen , who verbally acknowledged these results. Electronically Signed   By: Jeannine Boga M.D.   On: 10/13/2019 22:43    Procedures .Critical Care Performed by: Wyvonnia Dusky, MD Authorized by: Wyvonnia Dusky, MD   Critical care provider statement:    Critical care time (minutes):  40   Critical care was necessary to treat or prevent imminent or life-threatening deterioration of the following conditions:  CNS failure or compromise  and circulatory failure   Critical care was time spent personally by me on the following activities:  Discussions with consultants, evaluation of patient's response to treatment, examination of patient, ordering and performing treatments and interventions, ordering and review of laboratory studies, ordering and review of radiographic studies, pulse oximetry, re-evaluation of patient's condition, obtaining history from patient or surrogate and review of old charts Comments:     Stroke evaluation requiring specialist consultation, frequent bedside reassessments, family discussion   (including critical care time)  Medications Ordered in ED Medications  QUEtiapine (SEROQUEL) tablet 25 mg (has no administration in time range)  sertraline (ZOLOFT) tablet 200 mg (has no administration in time range)  zolpidem (AMBIEN) tablet 5 mg (has no administration in time range)  ARIPiprazole (ABILIFY) tablet 2 mg (has no administration in time range)  pantoprazole (PROTONIX) EC tablet 80 mg (has no administration in time range)  timolol (TIMOPTIC) 0.5 % ophthalmic solution 1 drop (has no administration in time range)  dorzolamide-timolol (COSOPT) 22.3-6.8 MG/ML ophthalmic solution 1 drop (has no administration in time range)  iohexol (OMNIPAQUE) 350 MG/ML injection 100 mL (100 mLs Intravenous Contrast Given 10/13/19 2235)  sodium chloride 0.9 % bolus 1,000 mL (1,000 mLs Intravenous New Bag/Given 10/13/19 2324)    ED Course  I have reviewed the triage vital signs and the nursing notes.  Pertinent labs & imaging results that were available during my care of the patient were reviewed by me and considered in my medical decision making (see chart for details).  73 year old female presenting to the ED with syncope that occurred around 8:45 PM dinner today.  She describes tunnel vision and lightheadedness immediately prior to her episode.  It appears that she may have been unconscious for a few minutes according to  her husband.  There is no reported tonic-clonic activity or postictal confusion afterwards suggests was a first-time seizure.  She arrives here appearing somewhat sluggish to me.  I believe she is a left-sided facial droop and some slurred speech, although her husband seems unclear whether this is her baseline or whether she is more slurred than usual.  The patient tells me she only had a single vodka tonic and does not feel drunk.  She does have difficulty with finger-to-nose with both hands.  Concerning for possible posterior or cerebellar stroke.  Based on the time of her symptom onset, initiated a stroke alert.  Patient is also noted to be in new A. fib with a rate between 85 and 100.  She reports no prior known history of atrial fibrillation.  She is having no palpitations.  Is unclear how long she has been in A. Fib.  Her blood pressure is a little bit on the soft side.  She reports has a history of hypertension but is not currently taking any antihypertensive medicines.  We will give her a liter of fluid.  It is possible this is still a cardiac event explaining her syncope.  She also need a rapid Covid test while she is here.  We will check her electrolytes and her hemoglobin level as well.    Clinical Course as of Oct 13 9  Sat Oct 13, 2019  2223 Code stroke last known wel; 8:45 pm   [MT]  2300 Telestroke eval, neurologist reports NIHSS 0 on his exam, he does not believe patient has true ataxia, nor slurred speech or facial droop.  CT imaging brain negative.  [facility] to [facility] for [facility] brain and TIA workup per neurology, but she will also need cardiac w/u and echocardiogram.  Awaiting remainder of basic labs here.  Patient otherwise stable.   [MT]  2349 Patient stable here.  Pending transfer to [facility], awaiting to hear whether they have available beds on telemetry, or else she will need to be an ED to ED transfer to get her MRI and echocardiogram   [MT]  Sun Oct 14, 2019    0010 There are no available tele beds at [facility], I spoke to ED attending Dr Cyndee Brightly and their ED charge nurse, and they will hold an ED bed for a direct ED to ED transfer, with the understanding that the patient is under the care of the hospitalist there.    [MT]    Clinical Course User Index [MT] Pearly Apachito, Carola Rhine, MD   Final Clinical Impression(s) / ED Diagnoses Final diagnoses:  Syncope, unspecified syncope type  Atrial fibrillation, unspecified type [facility])    Rx / DC Orders ED Discharge Orders    None       Gage Weant, Carola Rhine, MD 10/14/19 0010

## 2019-10-13 NOTE — Progress Notes (Signed)
Code Stroke Times  [id number] Pt in room 2230 Exam began 2232 Exam Finished 2233 Called to [facility] 2234 Completed in [place]

## 2019-10-13 NOTE — ED Notes (Signed)
Tele Neuro consult in progress.

## 2019-10-13 NOTE — Consult Note (Signed)
[facility] Consult Services   Date of Service:   10/13/2019 22:30:52  Impression:     .  R55 - Syncope (blackout, fainting, vasovagal attack)  Comments/Sign-Out: 55 F, here with episode of syncope and 'flashing lights' in her vision. Found to be in new onset afib. NIHSS 0 on my eval so not an alteplase candidate. No frank dysmetria or ataxia on FTN or HTS, no evidence of any other motor or sensory deficits. Language was intact. She does note that she is speaking 'slower' than usual but her speech is clear. Head CT neg for acute abnl. She should be adm for formal stroke workup.   PLAN  - fu CTA head/neck report  - MRI brain w/o contrast  - TTE  - check a1c and LDL  - monitor on tele  - neuro to follow  --  Metrics: Last Known Well: 10/13/2019 20:45:00 [facility] Notification Time: 10/13/2019 22:30:52 Arrival Time: 10/13/2019 21:40:00 Stamp Time: 10/13/2019 22:30:52 Time First Login Attempt: 10/13/2019 22:33:27 Video Start Time: 10/13/2019 22:33:27  Symptoms: ?slurred speech, LOC NIHSS Start Assessment Time: 10/13/2019 22:46:03 Patient is not a candidate for Alteplase/Activase. Patient was not deemed candidate for Alteplase/Activase thrombolytics because of Resolved symptoms (no residual disabling symptoms).  CT head showed no acute hemorrhage or acute core infarct.  Clinical Presentation is not Suggestive of Large Vessel Occlusive Disease  Sign Out:     .  Discussed with Emergency Department Provider  ------------------------------------------------------------------------------  History of Present Illness: Patient is a 73 year old Female.  Patient was brought by private transportation with symptoms of ?slurred speech, LOC  1 F, h/o HTN, HL, CLL, who was LKW at 8:45 PM, when she experienced an episode of loss of consciousness and slurred speech during dinner with friends. Pt was sitting at the table and began to feel  lightheaded. She then noted some 'flashing lights' in her vision. Husband then reported that she slumped back in her chair with eyes open staring off, but no tonic clonic activity was noted. Pt herself does not recall the fainting spell itself. She was out for a few mins and then remembers coming to and felt a bit groggy. She does endorse one alcoholic drink with dinner tonight. Does not have a history of syncope or stroke. Of note, she is found to be in new onset afib. Stroke alert was activated as there was a concern for ?L facial droop, slurred speech, as well as some L FTN dysmetria noted by ER doc. NIHSS 0 on my eval. Pt endorses 'slowed' speech but not slurring. I did not appreciate a facial droop. I did notice some shakiness of her hand during FTN more on the L than the R, but I did not think she was dysmetric or ataxic. She had no ataxia of HTS, and she did not exhibit any pass-pointing when she mirrored the nurse's pointer movements on either side.   Examination: Blood Glucose(137) 1A: Level of Consciousness - Alert; keenly responsive + 0 1B: Ask Month and Age - Both Questions Right + 0 1C: Blink Eyes & Squeeze Hands - Performs Both Tasks + 0 2: Test Horizontal Extraocular Movements - Normal + 0 3: Test Visual Fields - No Visual Loss + 0 4: Test Facial Palsy (Use Grimace if Obtunded) - Normal symmetry + 0 5A: Test Left Arm Motor Drift - No Drift for 10 Seconds + 0 5B: Test Right Arm Motor Drift - No Drift for 10 Seconds + 0 6A: Test Left Leg Motor  Drift - No Drift for 5 Seconds + 0 6B: Test Right Leg Motor Drift - No Drift for 5 Seconds + 0 7: Test Limb Ataxia (FNF/Heel-Shin) - No Ataxia + 0 8: Test Sensation - Normal; No sensory loss + 0 9: Test Language/Aphasia - Normal; No aphasia + 0 10: Test Dysarthria - Normal + 0 11: Test Extinction/Inattention - No abnormality + 0  NIHSS Score: 0  Pre-Morbid Modified Ranking Scale: 0 Points = No symptoms at all   Patient/Family was informed  the Neurology Consult would happen via TeleHealth consult by way of interactive audio and video telecommunications and consented to receiving care in this manner.   Due to the immediate potential for life-threatening deterioration due to underlying acute neurologic illness, I spent 25 minutes providing critical care. This time includes time for face to face visit via telemedicine, review of medical records, imaging studies and discussion of findings with providers, the patient and/or family.   Dr Burtis Junes   [facility] [phone]   Case [id number]

## 2019-10-13 NOTE — ED Notes (Signed)
Family at bedside. 

## 2019-10-14 ENCOUNTER — Observation Stay (HOSPITAL_COMMUNITY): Payer: Medicare Other

## 2019-10-14 ENCOUNTER — Encounter (HOSPITAL_COMMUNITY): Payer: Self-pay | Admitting: Internal Medicine

## 2019-10-14 DIAGNOSIS — Z853 Personal history of malignant neoplasm of breast: Secondary | ICD-10-CM | POA: Diagnosis not present

## 2019-10-14 DIAGNOSIS — E871 Hypo-osmolality and hyponatremia: Secondary | ICD-10-CM | POA: Diagnosis present

## 2019-10-14 DIAGNOSIS — C9110 Chronic lymphocytic leukemia of B-cell type not having achieved remission: Secondary | ICD-10-CM | POA: Diagnosis present

## 2019-10-14 DIAGNOSIS — G459 Transient cerebral ischemic attack, unspecified: Secondary | ICD-10-CM | POA: Diagnosis present

## 2019-10-14 DIAGNOSIS — K219 Gastro-esophageal reflux disease without esophagitis: Secondary | ICD-10-CM | POA: Diagnosis present

## 2019-10-14 DIAGNOSIS — R55 Syncope and collapse: Secondary | ICD-10-CM

## 2019-10-14 DIAGNOSIS — G629 Polyneuropathy, unspecified: Secondary | ICD-10-CM | POA: Diagnosis present

## 2019-10-14 DIAGNOSIS — Z20828 Contact with and (suspected) exposure to other viral communicable diseases: Secondary | ICD-10-CM | POA: Diagnosis present

## 2019-10-14 DIAGNOSIS — F329 Major depressive disorder, single episode, unspecified: Secondary | ICD-10-CM | POA: Diagnosis present

## 2019-10-14 DIAGNOSIS — G8194 Hemiplegia, unspecified affecting left nondominant side: Secondary | ICD-10-CM | POA: Diagnosis present

## 2019-10-14 DIAGNOSIS — I480 Paroxysmal atrial fibrillation: Secondary | ICD-10-CM | POA: Diagnosis present

## 2019-10-14 DIAGNOSIS — M4802 Spinal stenosis, cervical region: Secondary | ICD-10-CM | POA: Diagnosis present

## 2019-10-14 DIAGNOSIS — Z79899 Other long term (current) drug therapy: Secondary | ICD-10-CM | POA: Diagnosis not present

## 2019-10-14 DIAGNOSIS — R29700 NIHSS score 0: Secondary | ICD-10-CM | POA: Diagnosis present

## 2019-10-14 DIAGNOSIS — R29810 Facial weakness: Secondary | ICD-10-CM | POA: Diagnosis present

## 2019-10-14 DIAGNOSIS — Z823 Family history of stroke: Secondary | ICD-10-CM | POA: Diagnosis not present

## 2019-10-14 DIAGNOSIS — I4891 Unspecified atrial fibrillation: Secondary | ICD-10-CM | POA: Diagnosis not present

## 2019-10-14 DIAGNOSIS — E785 Hyperlipidemia, unspecified: Secondary | ICD-10-CM | POA: Diagnosis present

## 2019-10-14 DIAGNOSIS — D72829 Elevated white blood cell count, unspecified: Secondary | ICD-10-CM

## 2019-10-14 DIAGNOSIS — E876 Hypokalemia: Secondary | ICD-10-CM | POA: Diagnosis present

## 2019-10-14 DIAGNOSIS — I10 Essential (primary) hypertension: Secondary | ICD-10-CM | POA: Diagnosis present

## 2019-10-14 LAB — CBC WITH DIFFERENTIAL/PLATELET
Abs Immature Granulocytes: 0.02 10*3/uL (ref 0.00–0.07)
Basophils Absolute: 0.1 10*3/uL (ref 0.0–0.1)
Basophils Relative: 1 %
Eosinophils Absolute: 0.1 10*3/uL (ref 0.0–0.5)
Eosinophils Relative: 1 %
HCT: 35.5 % — ABNORMAL LOW (ref 36.0–46.0)
Hemoglobin: 12 g/dL (ref 12.0–15.0)
Immature Granulocytes: 0 %
Lymphocytes Relative: 76 %
Lymphs Abs: 13.2 10*3/uL — ABNORMAL HIGH (ref 0.7–4.0)
MCH: 30.7 pg (ref 26.0–34.0)
MCHC: 33.8 g/dL (ref 30.0–36.0)
MCV: 90.8 fL (ref 80.0–100.0)
Monocytes Absolute: 0.6 10*3/uL (ref 0.1–1.0)
Monocytes Relative: 4 %
Neutro Abs: 3.1 10*3/uL (ref 1.7–7.7)
Neutrophils Relative %: 18 %
Platelets: 377 10*3/uL (ref 150–400)
RBC: 3.91 MIL/uL (ref 3.87–5.11)
RDW: 13 % (ref 11.5–15.5)
WBC: 17.1 10*3/uL — ABNORMAL HIGH (ref 4.0–10.5)
nRBC: 0 % (ref 0.0–0.2)

## 2019-10-14 LAB — BASIC METABOLIC PANEL
Anion gap: 8 (ref 5–15)
BUN: 11 mg/dL (ref 8–23)
CO2: 27 mmol/L (ref 22–32)
Calcium: 9.5 mg/dL (ref 8.9–10.3)
Chloride: 99 mmol/L (ref 98–111)
Creatinine, Ser: 0.64 mg/dL (ref 0.44–1.00)
GFR calc Af Amer: >60 mL/min (ref >60)
GFR calc non Af Amer: >60 mL/min (ref >60)
Glucose, Bld: 112 mg/dL — ABNORMAL HIGH (ref 70–99)
Potassium: 4 mmol/L (ref 3.5–5.1)
Sodium: 134 mmol/L — ABNORMAL LOW (ref 135–145)

## 2019-10-14 LAB — OSMOLALITY: Osmolality: 285 mOsm/kg (ref 275–295)

## 2019-10-14 LAB — TROPONIN I (HIGH SENSITIVITY): Troponin I (High Sensitivity): 17 ng/L (ref <18)

## 2019-10-14 LAB — ECHOCARDIOGRAM COMPLETE
Height: 66 in
Weight: 2400 oz

## 2019-10-14 LAB — MAGNESIUM: Magnesium: 1.8 mg/dL (ref 1.7–2.4)

## 2019-10-14 LAB — SARS CORONAVIRUS 2 (TAT 6-24 HRS): SARS Coronavirus 2: NEGATIVE

## 2019-10-14 MED ORDER — LOSARTAN POTASSIUM 50 MG PO TABS
100.0000 mg | ORAL_TABLET | Freq: Every day | ORAL | Status: DC
  Administered 2019-10-14 – 2019-10-15 (×2): 100 mg via ORAL
  Filled 2019-10-14 (×2): qty 2

## 2019-10-14 MED ORDER — ASPIRIN 325 MG PO TABS
325.0000 mg | ORAL_TABLET | Freq: Every day | ORAL | Status: DC
  Administered 2019-10-14: 325 mg via ORAL
  Filled 2019-10-14: qty 1

## 2019-10-14 MED ORDER — QUETIAPINE FUMARATE 50 MG PO TABS
50.0000 mg | ORAL_TABLET | Freq: Every day | ORAL | Status: DC
  Administered 2019-10-14: 50 mg via ORAL
  Filled 2019-10-14: qty 1

## 2019-10-14 MED ORDER — ARIPIPRAZOLE 5 MG PO TABS
5.0000 mg | ORAL_TABLET | Freq: Every day | ORAL | Status: DC
  Administered 2019-10-14 – 2019-10-15 (×2): 5 mg via ORAL
  Filled 2019-10-14 (×2): qty 1

## 2019-10-14 MED ORDER — ZOLPIDEM TARTRATE 5 MG PO TABS
2.5000 mg | ORAL_TABLET | Freq: Every day | ORAL | Status: DC
  Administered 2019-10-14: 2.5 mg via ORAL
  Filled 2019-10-14: qty 1

## 2019-10-14 MED ORDER — AMIODARONE HCL 100 MG PO TABS
100.0000 mg | ORAL_TABLET | Freq: Every day | ORAL | Status: DC
  Administered 2019-10-14 – 2019-10-15 (×2): 100 mg via ORAL
  Filled 2019-10-14 (×2): qty 1

## 2019-10-14 MED ORDER — WHITE PETROLATUM EX OINT
TOPICAL_OINTMENT | CUTANEOUS | Status: AC
  Filled 2019-10-14: qty 28.35

## 2019-10-14 MED ORDER — PANTOPRAZOLE SODIUM 40 MG PO TBEC
40.0000 mg | DELAYED_RELEASE_TABLET | Freq: Every day | ORAL | Status: DC
  Administered 2019-10-14 – 2019-10-15 (×2): 40 mg via ORAL
  Filled 2019-10-14 (×2): qty 1

## 2019-10-14 MED ORDER — ACETAMINOPHEN 325 MG PO TABS: 650.0000 mg | ORAL_TABLET | ORAL | Status: DC | PRN

## 2019-10-14 MED ORDER — OXYBUTYNIN CHLORIDE 5 MG PO TABS
5.0000 mg | ORAL_TABLET | Freq: Every morning | ORAL | Status: DC
  Administered 2019-10-14 – 2019-10-15 (×2): 5 mg via ORAL
  Filled 2019-10-14 (×2): qty 1

## 2019-10-14 MED ORDER — SERTRALINE HCL 100 MG PO TABS
100.0000 mg | ORAL_TABLET | Freq: Every day | ORAL | Status: DC
  Administered 2019-10-14 – 2019-10-15 (×2): 100 mg via ORAL
  Filled 2019-10-14 (×2): qty 1

## 2019-10-14 MED ORDER — ENOXAPARIN SODIUM 40 MG/0.4ML ~~LOC~~ SOLN
40.0000 mg | SUBCUTANEOUS | Status: DC
  Administered 2019-10-14: 40 mg via SUBCUTANEOUS
  Filled 2019-10-14: qty 0.4

## 2019-10-14 MED ORDER — CLONAZEPAM 0.5 MG PO TABS
1.0000 mg | ORAL_TABLET | Freq: Every evening | ORAL | Status: DC
  Filled 2019-10-14 (×2): qty 2

## 2019-10-14 MED ORDER — POTASSIUM CHLORIDE CRYS ER 20 MEQ PO TBCR
20.0000 meq | EXTENDED_RELEASE_TABLET | Freq: Once | ORAL | Status: AC
  Administered 2019-10-14: 20 meq via ORAL
  Filled 2019-10-14: qty 1

## 2019-10-14 MED ORDER — ACETAMINOPHEN 160 MG/5ML PO SOLN: 650.0000 mg | ORAL | Status: DC | PRN

## 2019-10-14 MED ORDER — APIXABAN 5 MG PO TABS
5.0000 mg | ORAL_TABLET | Freq: Two times a day (BID) | ORAL | Status: DC
  Administered 2019-10-14 – 2019-10-15 (×3): 5 mg via ORAL
  Filled 2019-10-14 (×3): qty 1

## 2019-10-14 MED ORDER — ACETAMINOPHEN 650 MG RE SUPP: 650.0000 mg | RECTAL | Status: DC | PRN

## 2019-10-14 MED ORDER — STROKE: EARLY STAGES OF RECOVERY BOOK
Freq: Once | Status: AC
  Filled 2019-10-14: qty 1

## 2019-10-14 MED ORDER — ATORVASTATIN CALCIUM 80 MG PO TABS
80.0000 mg | ORAL_TABLET | Freq: Every day | ORAL | Status: DC
  Filled 2019-10-14 (×2): qty 1

## 2019-10-14 NOTE — ED Notes (Signed)
Leaving with [facility] at this time. 

## 2019-10-14 NOTE — ED Notes (Signed)
ED TO INPATIENT HANDOFF REPORT  ED Nurse Name and Phone #: Lunette Stands Hodge Name/Age/Gender Karen Bradford 73 y.o. female Room/Bed: 010C/010C  Code Status   Code Status: Not on file  Home/SNF/Other Home Patient oriented to: self, place, time and situation Is this baseline? Yes   Triage Complete: Triage complete  Chief Complaint TIA (transient ischemic attack) [G45.9]  Triage Note Pt was sitting at the dinner table and suddenly became dizzy and had a syncopal episode. Per family pt had a few episodes of syncope. Pt states she is feeling a bit better at this time.     Allergies Allergies  Allergen Reactions  . Colesevelam Rash    Level of Care/Admitting Diagnosis ED Disposition    ED Disposition Condition Comment   Transfer to Another [place]: [place] [[id number]] Level of Care: Telemetry Medical [104] I expect the patient will be discharged within 24 hours: Yes LOW acuity---Tx typically complete <24 hrs---ACUTE conditions typically can be evaluated <24 h ours---LABS likely to return to acceptable levels <24 hours---IS near functional baseline---EXPECTED to return to current living arrangement---NOT newly hypoxic: Meets criteria for 5C-Observation unit Covid Evaluation: Asymptomatic Screening Protoco l (No Symptoms) Diagnosis: TIA (transient ischemic attack) [[id number]] Admitting Physician: Shela Leff [[id number]] Attending Physician: Shela Leff [[id number]]       B Medical/Surgery History Past Medical History:  Diagnosis Date  . Anxiety   . Cancer ([place])    breast  . Chronic lymphocytic leukemia ([place])   . Depression   . GERD (gastroesophageal reflux disease)   . Glaucoma   . Hypertension   . Lymphocytosis   . Polyneuropathic pain    Past Surgical History:  Procedure Laterality Date  . ARTHRODESIS METATARSALPHALANGEAL JOINT (MTPJ) Right 08/11/2017   Procedure: Right Hallux Metatarsal Phalangeal Joint Arthrodesis;  Excision of Bunionette;  Surgeon: Wylene Simmer, MD;  Location: [facility];  Service: Orthopedics;  Laterality: Right;  . BREAST SURGERY     mastectomy  . GLAUCOMA SURGERY       A IV Location/Drains/Wounds Patient Lines/Drains/Airways Status   Active Line/Drains/Airways    Name:   Placement date:   Placement time:   Site:   Days:   Peripheral IV 10/13/19 Left Antecubital   10/13/19    2225    Antecubital   1          Intake/Output Last 24 hours  Intake/Output Summary (Last 24 hours) at 10/14/2019 0245 Last data filed at 10/14/2019 0059 Gross per 24 hour  Intake 1000 ml  Output 450 ml  Net 550 ml    Labs/Imaging Results for orders placed or performed during the hospital encounter of 10/13/19 (from the past 48 hour(s))  CBG monitoring, ED     Status: Abnormal   Collection Time: 10/13/19 10:18 PM  Result Value Ref Range   Glucose-Capillary 137 (H) 70 - 99 mg/dL  Basic metabolic panel     Status: Abnormal   Collection Time: 10/13/19 10:37 PM  Result Value Ref Range   Sodium 129 (L) 135 - 145 mmol/L   Potassium 3.4 (L) 3.5 - 5.1 mmol/L   Chloride 95 (L) 98 - 111 mmol/L   CO2 23 22 - 32 mmol/L   Glucose, Bld 159 (H) 70 - 99 mg/dL   BUN 13 8 - 23 mg/dL   Creatinine, Ser 0.82 0.44 - 1.00 mg/dL   Calcium 10.1 8.9 - 10.3 mg/dL   GFR calc non Af Amer >60 >60 mL/min  GFR calc Af Amer >60 >60 mL/min   Anion gap 11 5 - 15    Comment: Performed at [facility], [address] [place], [place] [address]  CBC     Status: Abnormal   Collection Time: 10/13/19 10:37 PM  Result Value Ref Range   WBC 22.2 (H) 4.0 - 10.5 K/uL   RBC 4.41 3.87 - 5.11 MIL/uL   Hemoglobin 13.1 12.0 - 15.0 g/dL   HCT 39.9 36.0 - 46.0 %   MCV 90.5 80.0 - 100.0 fL   MCH 29.7 26.0 - 34.0 pg   MCHC 32.8 30.0 - 36.0 g/dL   RDW 12.8 11.5 - 15.5 %   Platelets 411 (H) 150 - 400 K/uL   nRBC 0.2 0.0 - 0.2 %    Comment: Performed at [facility], [address] [place], [place] [address]  Troponin I (High Sensitivity)     Status: None   Collection Time: 10/13/19 10:37 PM  Result Value Ref Range   Troponin I (High Sensitivity) 12 <18 ng/L    Comment: (NOTE) Elevated high sensitivity troponin I (hsTnI) values and significant  changes across serial measurements may suggest ACS but many other  chronic and acute conditions are known to elevate hsTnI results.  Refer to the "Links" section for chest pain algorithms and additional  guidance. Performed at [facility], [address] [place], [place] [address]   Ethanol     Status: None   Collection Time: 10/13/19 10:37 PM  Result Value Ref Range   Alcohol, Ethyl (B) <10 <10 mg/dL    Comment: (NOTE) Lowest detectable limit for serum alcohol is 10 mg/dL. For medical purposes only. Performed at [facility], [address] [place], [place] [address]   Protime-INR     Status: None   Collection Time: 10/13/19 10:37 PM  Result Value Ref Range   Prothrombin Time 13.8 11.4 - 15.2 seconds   INR 1.1 0.8 - 1.2    Comment: (NOTE) INR goal varies based on device and disease states. Performed at [facility], [address] [place], [place] [address]   APTT     Status: None   Collection Time: 10/13/19 10:37 PM  Result Value Ref Range   aPTT 27 24 - 36 seconds    Comment: Performed at [facility], [address] [place], [place] [address]  Differential     Status: Abnormal   Collection Time: 10/13/19 10:37 PM  Result Value Ref Range   Neutrophils Relative % 34 %   Neutro Abs 7.5 1.7 - 7.7 K/uL   Lymphocytes Relative 57 %   Lymphs Abs 12.9 (H) 0.7 - 4.0 K/uL   Monocytes Relative 7 %   Monocytes Absolute 1.4 (H) 0.1 - 1.0 K/uL   Eosinophils Relative 1 %   Eosinophils Absolute 0.2 0.0 - 0.5 K/uL   Basophils Relative 1 %   Basophils Absolute 0.1 0.0 - 0.1 K/uL   RBC Morphology MORPHOLOGY UNREMARKABLE    Smear Review Normal platelet morphology    Immature  Granulocytes 0 %   Abs Immature Granulocytes 0.07 0.00 - 0.07 K/uL   Smudge Cells PRESENT     Comment: Performed at [facility], [address] [place], [place] [address]  Urinalysis, Routine w reflex microscopic     Status: Abnormal   Collection Time: 10/13/19 11:01 PM  Result Value Ref  Range   Color, Urine STRAW (A) YELLOW   APPearance CLEAR CLEAR   Specific Gravity, Urine <1.005 (L) 1.005 - 1.030   pH 7.0 5.0 - 8.0   Glucose, UA NEGATIVE NEGATIVE mg/dL   Hgb urine dipstick TRACE (A) NEGATIVE   Bilirubin Urine NEGATIVE NEGATIVE   Ketones, ur NEGATIVE NEGATIVE mg/dL   Protein, ur 100 (A) NEGATIVE mg/dL   Nitrite NEGATIVE NEGATIVE   Leukocytes,Ua NEGATIVE NEGATIVE    Comment: Performed at [facility], [address] [place], [place] [address]  Urinalysis, Microscopic (reflex)     Status: None   Collection Time: 10/13/19 11:01 PM  Result Value Ref Range   RBC / HPF 0-5 0 - 5 RBC/hpf   WBC, UA 0-5 0 - 5 WBC/hpf   Bacteria, UA NONE SEEN NONE SEEN   Squamous Epithelial / LPF 0-5 0 - 5   Hyaline Casts, UA PRESENT    Granular Casts, UA PRESENT     Comment: Performed at [facility], [address] [place], [place] [address]  Urine rapid drug screen (hosp performed)     Status: None   Collection Time: 10/13/19 11:02 PM  Result Value Ref Range   Opiates NONE DETECTED NONE DETECTED   Cocaine NONE DETECTED NONE DETECTED   Benzodiazepines NONE DETECTED NONE DETECTED   Amphetamines NONE DETECTED NONE DETECTED   Tetrahydrocannabinol NONE DETECTED NONE DETECTED   Barbiturates NONE DETECTED NONE DETECTED    Comment: (NOTE) DRUG SCREEN FOR MEDICAL PURPOSES ONLY.  IF CONFIRMATION IS NEEDED FOR ANY PURPOSE, NOTIFY LAB WITHIN 5 DAYS. LOWEST DETECTABLE LIMITS FOR URINE DRUG SCREEN Drug Class                     Cutoff (ng/mL) Amphetamine and metabolites    1000 Barbiturate and metabolites    200 Benzodiazepine                 350 Tricyclics and  metabolites     300 Opiates and metabolites        300 Cocaine and metabolites        300 THC                            50 Performed at [facility], [address] [place], [place] [address]   SARS Coronavirus 2 Ag (30 min TAT) - Nasal Swab (BD Veritor Kit)     Status: None   Collection Time: 10/13/19 11:02 PM   Specimen: Nasal Swab (BD Veritor Kit)  Result Value Ref Range   SARS Coronavirus 2 Ag NEGATIVE NEGATIVE    Comment: (NOTE) SARS-CoV-2 antigen NOT DETECTED.  Negative results are presumptive.  Negative results do not preclude SARS-CoV-2 infection and should not be used as the sole basis for treatment or other patient management decisions, including infection  control decisions, particularly in the presence of clinical signs and  symptoms consistent with COVID-19, or in those who have been in contact with the virus.  Negative results must be combined with clinical observations, patient history, and epidemiological information. The expected result is Negative. Fact Sheet for Patients: PodPark.tn Fact Sheet for Healthcare Providers: GiftContent.is This test is not yet approved or cleared by the [place] FDA and  has been authorized for detection and/or diagnosis of SARS-CoV-2 by FDA under an Emergency Use Authorization (EUA).  This EUA will remain in effect (meaning this  test can be used) for the duration of  the COVID-19 de claration under Section 564(b)(1) of the Act, 21 U.S.C. section 360bbb-3(b)(1), unless the authorization is terminated or revoked sooner. Performed at [facility], [address] [place], [place] [address]   Troponin I (High Sensitivity)     Status: None   Collection Time: 10/14/19 12:52 AM  Result Value Ref Range   Troponin I (High Sensitivity) 17 <18 ng/L    Comment: (NOTE) Elevated high sensitivity troponin I (hsTnI) values and significant  changes across  serial measurements may suggest ACS but many other  chronic and acute conditions are known to elevate hsTnI results.  Refer to the "Links" section for chest pain algorithms and additional  guidance. Performed at [facility], [address] [place], [place] [address]    CT Angio Head W or [place] Contrast  Result Date: 10/13/2019 CLINICAL DATA:  Initial evaluation for acute dizziness, syncope. EXAM: CT ANGIOGRAPHY HEAD AND NECK TECHNIQUE: Multidetector CT imaging of the head and neck was performed using the standard protocol during bolus administration of intravenous contrast. Multiplanar CT image reconstructions and MIPs were obtained to evaluate the vascular anatomy. Carotid stenosis measurements (when applicable) are obtained utilizing NASCET criteria, using the distal internal carotid diameter as the denominator. CONTRAST:  167m OMNIPAQUE IOHEXOL 350 MG/ML SOLN COMPARISON:  Prior head CT from earlier same day. FINDINGS: CTA NECK FINDINGS Aortic arch: Visualized aortic arch of normal caliber with normal branch pattern. Scattered atheromatous plaque noted about the arch itself. No hemodynamically significant stenosis seen about the origin the great vessels. Visualized subclavian arteries widely patent. Right carotid system: Right common carotid artery widely patent from its origin to the bifurcation without stenosis. Mild atheromatous irregularity about the right bifurcation without stenosis. Right ICA widely patent distally to the skull base without stenosis, dissection or occlusion. Left carotid system: Left CCA widely patent from its origin to the bifurcation without stenosis. Mild atheromatous irregularity about the left bifurcation without stenosis. Left ICA widely patent distally to the skull base without stenosis, dissection or occlusion. Vertebral arteries: Both vertebral arteries arise from subclavian arteries. Left vertebral artery slightly dominant, with a diffusely hypoplastic right  vertebral artery. Focal plaque at the origin left vertebral artery with no more than mild stenosis. Vertebral arteries otherwise widely patent within the neck without stenosis dissection or occlusion. Skeleton: No acute osseous abnormality. No discrete osseous lesions. Moderate cervical spondylosis noted at C3-4 through C6-7. Other neck: No other acute soft tissue abnormality within the neck. No mass lesion or adenopathy. Few scattered subcentimeter hypodense thyroid nodules noted, for which no follow-up imaging recommended given size and patient age. Upper chest: Visualized upper chest demonstrates no acute finding. Review of the MIP images confirms the above findings CTA HEAD FINDINGS Anterior circulation: Petrous segments widely patent bilaterally. Mild atheromatous change within the carotid siphons without significant stenosis. A1 segments patent bilaterally. Normal anterior communicating artery complex. Anterior cerebral arteries widely patent or distal aspects without stenosis. No M1 stenosis or occlusion. Normal MCA bifurcations. Distal MCA branches well perfused and symmetric. Posterior circulation: Vertebral arteries widely patent to the vertebrobasilar junction without stenosis. Posterior inferior cerebral arteries patent bilaterally. Basilar widely patent to its distal aspect. Superior cerebral arteries patent bilaterally. Left PCA primarily supplied via the basilar. Fetal type origin of the right PCA. Both PCAs widely patent to their distal aspects. Venous sinuses: Patent. Anatomic variants: Fetal type origin right PCA. No intracranial aneurysm. Review of the  MIP images confirms the above findings IMPRESSION: 1. Negative CTA for emergent large vessel occlusion. 2. Mild atheromatous change about the aortic arch, carotid bifurcations, and carotid siphons without hemodynamically significant or correctable stenosis. Electronically Signed   By: Jeannine Boga M.D.   On: 10/13/2019 23:22   CT Angio  Neck W and/or Wo Contrast  Result Date: 10/13/2019 CLINICAL DATA:  Initial evaluation for acute dizziness, syncope. EXAM: CT ANGIOGRAPHY HEAD AND NECK TECHNIQUE: Multidetector CT imaging of the head and neck was performed using the standard protocol during bolus administration of intravenous contrast. Multiplanar CT image reconstructions and MIPs were obtained to evaluate the vascular anatomy. Carotid stenosis measurements (when applicable) are obtained utilizing NASCET criteria, using the distal internal carotid diameter as the denominator. CONTRAST:  141m OMNIPAQUE IOHEXOL 350 MG/ML SOLN COMPARISON:  Prior head CT from earlier same day. FINDINGS: CTA NECK FINDINGS Aortic arch: Visualized aortic arch of normal caliber with normal branch pattern. Scattered atheromatous plaque noted about the arch itself. No hemodynamically significant stenosis seen about the origin the great vessels. Visualized subclavian arteries widely patent. Right carotid system: Right common carotid artery widely patent from its origin to the bifurcation without stenosis. Mild atheromatous irregularity about the right bifurcation without stenosis. Right ICA widely patent distally to the skull base without stenosis, dissection or occlusion. Left carotid system: Left CCA widely patent from its origin to the bifurcation without stenosis. Mild atheromatous irregularity about the left bifurcation without stenosis. Left ICA widely patent distally to the skull base without stenosis, dissection or occlusion. Vertebral arteries: Both vertebral arteries arise from subclavian arteries. Left vertebral artery slightly dominant, with a diffusely hypoplastic right vertebral artery. Focal plaque at the origin left vertebral artery with no more than mild stenosis. Vertebral arteries otherwise widely patent within the neck without stenosis dissection or occlusion. Skeleton: No acute osseous abnormality. No discrete osseous lesions. Moderate cervical  spondylosis noted at C3-4 through C6-7. Other neck: No other acute soft tissue abnormality within the neck. No mass lesion or adenopathy. Few scattered subcentimeter hypodense thyroid nodules noted, for which no follow-up imaging recommended given size and patient age. Upper chest: Visualized upper chest demonstrates no acute finding. Review of the MIP images confirms the above findings CTA HEAD FINDINGS Anterior circulation: Petrous segments widely patent bilaterally. Mild atheromatous change within the carotid siphons without significant stenosis. A1 segments patent bilaterally. Normal anterior communicating artery complex. Anterior cerebral arteries widely patent or distal aspects without stenosis. No M1 stenosis or occlusion. Normal MCA bifurcations. Distal MCA branches well perfused and symmetric. Posterior circulation: Vertebral arteries widely patent to the vertebrobasilar junction without stenosis. Posterior inferior cerebral arteries patent bilaterally. Basilar widely patent to its distal aspect. Superior cerebral arteries patent bilaterally. Left PCA primarily supplied via the basilar. Fetal type origin of the right PCA. Both PCAs widely patent to their distal aspects. Venous sinuses: Patent. Anatomic variants: Fetal type origin right PCA. No intracranial aneurysm. Review of the MIP images confirms the above findings IMPRESSION: 1. Negative CTA for emergent large vessel occlusion. 2. Mild atheromatous change about the aortic arch, carotid bifurcations, and carotid siphons without hemodynamically significant or correctable stenosis. Electronically Signed   By: BJeannine BogaM.D.   On: 10/13/2019 23:22   DG Chest Portable 1 View  Result Date: 10/13/2019 CLINICAL DATA:  Infectious workup. Sudden onset of dizziness leading to syncope. EXAM: PORTABLE CHEST 1 VIEW COMPARISON:  Lung apices from neck CTA earlier this day. FINDINGS: Heart is normal in size. Aortic tortuosity, otherwise  normal  mediastinal contours. Aortic atherosclerosis. Pulmonary vasculature is normal. No consolidation, pleural effusion, or pneumothorax. No acute osseous abnormalities are seen. IMPRESSION: 1. No acute abnormality.  No evidence of intrathoracic infection. 2.  Aortic Atherosclerosis (ICD10-I70.0). Electronically Signed   By: Keith Rake M.D.   On: 10/13/2019 23:36   CT HEAD CODE STROKE WO CONTRAST  Result Date: 10/13/2019 CLINICAL DATA:  Code stroke. Initial evaluation for acute dizziness, syncope. EXAM: CT HEAD WITHOUT CONTRAST TECHNIQUE: Contiguous axial images were obtained from the base of the skull through the vertex without intravenous contrast. COMPARISON:  Prior head CT from 09/10/2005. FINDINGS: Brain: Generalized age-related cerebral atrophy with mild chronic small vessel ischemic disease. No acute intracranial hemorrhage. No acute large vessel territory infarct. No mass lesion, midline shift or mass effect. No hydrocephalus. No extra-axial fluid collection. Small dilated perivascular space noted at the inferior right basal ganglia. Vascular: No hyperdense vessel. Skull: Scalp soft tissues and calvarium within normal limits. Sinuses/Orbits: Globes orbital soft tissues normal. Paranasal sinuses mastoid air cells are clear. Other: None. ASPECTS [facility] Stroke Program Early CT Score) - Ganglionic level infarction (caudate, lentiform nuclei, internal capsule, insula, M1-M3 cortex): 7 - Supraganglionic infarction (M4-M6 cortex): 3 Total score (0-10 with 10 being normal): 10 IMPRESSION: 1. No acute intracranial infarct or other abnormality identified. 2. ASPECTS is 10. 3. Age-related cerebral atrophy with mild chronic small vessel ischemic disease. Critical Value/emergent results were called by telephone at the time of interpretation on 10/13/2019 at 10:42 pm to providerMATTHEW TRIFAN , who verbally acknowledged these results. Electronically Signed   By: Jeannine Boga M.D.   On: 10/13/2019 22:43     Pending Labs Unresulted Labs (From admission, onward)    Start     Ordered   10/13/19 2335  SARS CORONAVIRUS 2 (TAT 6-24 HRS) Nasopharyngeal Nasopharyngeal Swab  (Tier 3 (TAT 6-24 hrs))  ONCE - STAT,   STAT    Question Answer Comment  Is this test for diagnosis or screening Screening   Symptomatic for COVID-19 as defined by CDC No   Hospitalized for COVID-19 No   Admitted to ICU for COVID-19 No   Previously tested for COVID-19 Yes   Resident in a congregate (group) care setting No   Employed in healthcare setting No   Pregnant No      10/13/19 2335   10/13/19 2237  Pathologist smear review  Once,   AD     10/13/19 2237          Vitals/Pain Today's Vitals   10/14/19 0031 10/14/19 0040 10/14/19 0142 10/14/19 0151  BP:  134/71 127/63   Pulse: 74 72 67   Resp: _0 Temp:  97.6 F (36.4 C) 97.6 F (36.4 C)   TempSrc:  Oral Oral   SpO2: 98% 98% 98%   Weight:      Height:      PainSc:    0-No pain    Isolation Precautions No active isolations  Medications Medications  QUEtiapine (SEROQUEL) tablet 25 mg (25 mg Oral Given 10/14/19 0204)  sertraline (ZOLOFT) tablet 200 mg (has no administration in time range)  zolpidem (AMBIEN) tablet 5 mg (5 mg Oral Given 10/14/19 0204)  ARIPiprazole (ABILIFY) tablet 2 mg (has no administration in time range)  pantoprazole (PROTONIX) EC tablet 80 mg (has no administration in time range)  timolol (TIMOPTIC) 0.5 % ophthalmic solution 1 drop (0 drops Both Eyes Hold 10/14/19 0016)  dorzolamide-timolol (COSOPT) 22.3-6.8 MG/ML ophthalmic solution 1 drop (  0 drops Both Eyes Hold 10/14/19 0015)  iohexol (OMNIPAQUE) 350 MG/ML injection 100 mL (100 mLs Intravenous Contrast Given 10/13/19 2235)  sodium chloride 0.9 % bolus 1,000 mL (0 mLs Intravenous Stopped 10/14/19 0059)    Mobility walks Low fall risk   Focused Assessments Neuro Assessment Handoff:  Swallow screen pass? Yes  Cardiac Rhythm: Atrial fibrillation NIH Stroke Scale  ( + Modified Stroke Scale Criteria)  Interval: Shift assessment Level of Consciousness (1a.)   : Alert, keenly responsive LOC Questions (1b. )   +: Answers both questions correctly LOC Commands (1c. )   + : Performs both tasks correctly Best Gaze (2. )  +: Normal Visual (3. )  +: No visual loss Facial Palsy (4. )    : Normal symmetrical movements Motor Arm, Left (5a. )   +: No drift Motor Arm, Right (5b. )   +: No drift Motor Leg, Left (6a. )   +: No drift Motor Leg, Right (6b. )   +: No drift Limb Ataxia (7. ): Absent Sensory (8. )   +: Normal, no sensory loss Best Language (9. )   +: No aphasia Dysarthria (10. ): Normal Extinction/Inattention (11.)   +: No Abnormality Modified SS Total  +: 0 Complete NIHSS TOTAL: 0     Neuro Assessment: Within Defined Limits Neuro Checks:   Initial (10/13/19 2305)  Last Documented NIHSS Modified Score: 0 (10/14/19 0153) Has TPA been given? No If patient is a Neuro Trauma and patient is going to OR before floor call report to [facility] nurse: [phone] or [phone]     R Recommendations: See Admitting Provider Note  Report given to:   Additional Notes: N/A

## 2019-10-14 NOTE — ED Notes (Signed)
Received report from [facility]:  Pt had episode of syncope at dinner table with family. Unsure if there was slurred speech or facial droop at that time. Pt ambulated from [place] to ED stretcher. Pt feels tired at this time with no other complaints. Refer to assessments for Neurological assessments.

## 2019-10-14 NOTE — Consult Note (Addendum)
Referring Physician: Shela Leff, MD    Reason for Consult: TIA / syncope  HPI: Karen Bradford is an 73 y.o. female with a history of breast cancer, anxiety, depression, polyneuropathic pain, chronic lymphocytic leukemia, and hypertension transferred from [facility] to [facility] after she presented there for evaluation of an episode of syncope and flashing lights in her vision. She was found to be in new onset afib. She was seen in consultation by Burtis Junes, MD for a tele-neurology. NIHSS was noted to be 0.   The patient and her husband were having dinner on the evening of 10/13/19 with some friends. Around 8:45 PM, she reported feeling lightheaded with tunnel vision and noted a visual disturbance. She then lost consciousness. Her husband reports that she simply slumped back in her chair and was staring with eyes open. No seizure like activity was noted. The husband believed she was unconscious for a few minutes, after which she awoke and was groggy.  She never had a similar episode. She felt back to baseline in the ED in [facility].  She denied any headache, chest palpitations, or shortness of breath. She denied any further visual changes.   The patients blood pressure was noted to be mildly low. She was given fluids and transferred to [facility]. Imaging showed no acute abnormalities. She is on ASA 325 mg but has not yet been anticoagulated for atrial fibrillation documented by ECG - ventricular response controlled. UDS - negative. SARS - negative. ETH < 10. The patient reported having one alcoholic beverage with dinner.   Date last known well: 10/13/19 Time last known well: 8:45 PM tPA Given: no - back to baseline Patient states that she is done well since admission she has had no further episodes of syncope or passing out.  She denies any known prior history of atrial fibrillation, TIA or strokes. Past Medical History Past Medical History:  Diagnosis Date  . Anxiety   . Cancer ([place])     breast  . Chronic lymphocytic leukemia ([place])   . Depression   . GERD (gastroesophageal reflux disease)   . Glaucoma   . Hypertension   . Lymphocytosis   . Polyneuropathic pain     Surgical History Past Surgical History:  Procedure Laterality Date  . ARTHRODESIS METATARSALPHALANGEAL JOINT (MTPJ) Right 08/11/2017   Procedure: Right Hallux Metatarsal Phalangeal Joint Arthrodesis; Excision of Bunionette;  Surgeon: Wylene Simmer, MD;  Location: [facility];  Service: Orthopedics;  Laterality: Right;  . BREAST SURGERY     mastectomy  . GLAUCOMA SURGERY      Family History  Family History  Problem Relation Age of Onset  . Stroke Mother   . Brain cancer Father     Social History:   reports that she has never smoked. She has never used smokeless tobacco. She reports current alcohol use of about 2.0 standard drinks of alcohol per week. She reports that she does not use drugs.  Allergies:  Allergies  Allergen Reactions  . Colesevelam Rash    Home Medications:  Medications Prior to Admission  Medication Sig Dispense Refill  . ARIPiprazole (ABILIFY) 5 MG tablet Take 2 mg by mouth daily.     . cholecalciferol (VITAMIN D) 1000 units tablet Take 1,000 Units by mouth daily.    . clonazePAM (KLONOPIN) 1 MG tablet Take 1 mg by mouth 2 (two) times daily.    . dorzolamide-timolol (COSOPT) 22.3-6.8 MG/ML ophthalmic solution 1 drop 2 (two) times daily.    Marland Kitchen  losartan (COZAAR) 100 MG tablet Take 100 mg by mouth daily.    . magnesium oxide (MAG-OX) 400 MG tablet Take 400 mg by mouth daily.    Marland Kitchen omeprazole (PRILOSEC) 40 MG capsule Take 40 mg by mouth daily.    Marland Kitchen oxybutynin (DITROPAN) 5 MG tablet Take 5 mg by mouth 3 (three) times daily.    . QUEtiapine (SEROQUEL) 100 MG tablet Take 25 mg by mouth at bedtime.     . sertraline (ZOLOFT) 100 MG tablet Take 200 mg by mouth daily.     . timolol (TIMOPTIC) 0.5 % ophthalmic solution 1 drop 2 (two) times daily.    Marland Kitchen zolpidem (AMBIEN) 5 MG  tablet Take 5 mg by mouth at bedtime as needed for sleep.      Hospital Medications . aspirin  325 mg Oral Daily  . atorvastatin  80 mg Oral q1800  . enoxaparin (LOVENOX) injection  40 mg Subcutaneous Q24H    ROS:  History obtained from chart review and the patient   General ROS: negative for - chills, fatigue, fever, night sweats, weight gain or weight loss Psychological ROS: negative for - behavioral disorder, hallucinations, memory difficulties, mood swings or suicidal ideation Ophthalmic ROS: negative for - blurry vision, double vision, eye pain or loss of vision ENT ROS: negative for - epistaxis, nasal discharge, oral lesions, sore throat, tinnitus or vertigo Allergy and Immunology ROS: negative for - hives or itchy/watery eyes Hematological and Lymphatic ROS: negative for - bleeding problems, bruising or swollen lymph nodes Endocrine ROS: negative for - galactorrhea, hair pattern changes, polydipsia/polyuria or temperature intolerance Respiratory ROS: negative for - cough, hemoptysis, shortness of breath or wheezing Cardiovascular ROS: negative for - chest pain, dyspnea on exertion, edema or irregular heartbeat Gastrointestinal ROS: negative for - abdominal pain, diarrhea, hematemesis, nausea/vomiting or stool incontinence Genito-Urinary ROS: negative for - dysuria, hematuria, incontinence or urinary frequency/urgency Musculoskeletal ROS: negative for - joint swelling or muscular weakness Neurological ROS: as noted in HPI Dermatological ROS: negative for rash and skin lesion changes   Physical Examination:  Vitals:   10/14/19 0402 10/14/19 0618 10/14/19 0632 10/14/19 0800  BP: 116/66 134/68 136/72 136/68  Pulse: (!) 59 (!) 56 (!) 56 61  Resp: 16 17 16 16   Temp: 97.7 F (36.5 C) 97.8 F (36.6 C) 98.1 F (36.7 C)   TempSrc: Oral Oral Oral   SpO2: 99% 97% 100% 99%  Weight:      Height:       Pleasant elderly Caucasian lady not in distress. . Afebrile. Head is  nontraumatic. Neck is supple without bruit.    Cardiac exam no murmur or gallop. Lungs are clear to auscultation. Distal pulses are well felt.  Neurological Exam ;  Awake  Alert oriented x 3. Normal speech and language.eye movements full without nystagmus.fundi were not visualized. Vision acuity and fields appear normal. Hearing is normal. Palatal movements are normal. Face symmetric. Tongue midline. Normal strength, tone, reflexes and coordination. Normal sensation. Gait deferred.        LABORATORY STUDIES:  Basic Metabolic Panel: Recent Labs  Lab 10/13/19 2237 10/14/19 0606  NA 129* 134*  K 3.4* 4.0  CL 95* 99  CO2 23 27  GLUCOSE 159* 112*  BUN 13 11  CREATININE 0.82 0.64  CALCIUM 10.1 9.5  MG  --  1.8    Liver Function Tests: No results for input(s): AST, ALT, ALKPHOS, BILITOT, PROT, ALBUMIN in the last 168 hours. No results for input(s): LIPASE, AMYLASE  in the last 168 hours. No results for input(s): AMMONIA in the last 168 hours.  CBC: Recent Labs  Lab 10/13/19 Feb 15, 2236 10/14/19 0606  WBC 22.2* 17.1*  NEUTROABS 7.5 3.1  HGB 13.1 12.0  HCT 39.9 35.5*  MCV 90.5 90.8  PLT 411* 377    Cardiac Enzymes: No results for input(s): CKTOTAL, CKMB, CKMBINDEX, TROPONINI in the last 168 hours.  BNP: Invalid input(s): POCBNP  CBG: Recent Labs  Lab 10/13/19 2218  GLUCAP 137*    Microbiology:   Coagulation Studies: Recent Labs    10/13/19 Feb 15, 2236  LABPROT 13.8  INR 1.1    Urinalysis:  Recent Labs  Lab 10/13/19 2301  COLORURINE STRAW*  LABSPEC <1.005*  PHURINE 7.0  GLUCOSEU NEGATIVE  HGBUR TRACE*  BILIRUBINUR NEGATIVE  KETONESUR NEGATIVE  PROTEINUR 100*  NITRITE NEGATIVE  LEUKOCYTESUR NEGATIVE    Lipid Panel:  No results found for: CHOL, TRIG, HDL, CHOLHDL, VLDL, LDLCALC  HgbA1C:  No results found for: HGBA1C  Urine Drug Screen:      Component Value Date/Time   LABOPIA NONE DETECTED 10/13/2019 2302   COCAINSCRNUR NONE DETECTED 10/13/2019  2302   LABBENZ NONE DETECTED 10/13/2019 2302   AMPHETMU NONE DETECTED 10/13/2019 2302   THCU NONE DETECTED 10/13/2019 2302   LABBARB NONE DETECTED 10/13/2019 2302     Alcohol Level:  Recent Labs  Lab 10/13/19 02/15/36  ETH <10    Miscellaneous labs:  EKG (10/13/19) Atrial fibrillation - ventricular response 92 BPM (See cardiology reading for complete details)    IMAGING  CT Angio Head W or Wo Contrast CT Angio Neck W and/or Wo Contrast 10/13/2019 IMPRESSION:  1. Negative CTA for emergent large vessel occlusion.  2. Mild atheromatous change about the aortic arch, carotid bifurcations, and carotid siphons without hemodynamically significant or correctable stenosis.   MR BRAIN WO CONTRAST 10/14/2019 IMPRESSION:  1. No acute intracranial abnormality.  2. Age-related cerebral atrophy with mild chronic small vessel ischemic disease.  3. Degenerative disc osteophyte at C3-4 with resultant moderate spinal stenosis. Finding could be further assessed with dedicated MRI of the cervical spine as clinically desired.    CT HEAD CODE STROKE WO CONTRAST 10/13/2019 IMPRESSION:  1. No acute intracranial infarct or other abnormality identified.  2. ASPECTS is 10.  3. Age-related cerebral atrophy with mild chronic small vessel ischemic disease.    DG Chest Portable 1 View 10/13/2019 IMPRESSION:  1. No acute abnormality.  No evidence of intrathoracic infection.  2.  Aortic Atherosclerosis (ICD10-I70.0).     CT HEAD CODE STROKE WO CONTRAST 10/13/2019 IMPRESSION:  1. No acute intracranial infarct or other abnormality identified.  2. ASPECTS is 10.  3. Age-related cerebral atrophy with mild chronic small vessel ischemic disease.    Transthoracic Echocardiogram -normal ejection fraction.  No clot  Assessment:  Ms. Karen Bradford is a 73 y.o. female with history of  breast cancer, anxiety, depression, polyneuropathic pain, chronic lymphocytic leukemia, and hypertension (not on  anti-htn meds) transferred from [facility] to [facility] after she presented there for evaluation of an episode of syncope and found to have newly diagnosed atrial fibrillation.  She did not receive IV t-PA due to NIHSSS of 0.  Transient left hemiplegia secondary to right hemispheric TIA from new onset A. fib presenting with syncope.   Resultant no deficits  Code Stroke CT Head - not ordered  CT head - no acute findings.  MRI head  - no acute findings.  MRA head - not ordered  CTA H&N - negative  CT Perfusion - not ordered  Carotid Doppler - CTA neck performed - carotid dopplers not indicated.  2D Echo -normal ejection fraction.  No clot.  Sars Corona Virus 2 - negative  LDL - pending  HgbA1c - pending  UDS - negative  VTE prophylaxis - Lovenox Diet  Diet Order            Diet Heart Room service appropriate? Yes; Fluid consistency: Thin  Diet effective now              No antithrombotic prior to admission, now on Eliquis   Patient counseled to be compliant with her antithrombotic medications  Ongoing aggressive stroke risk factor management  Therapy recommendations:  pending  Disposition:  Pending  Hypertension  Home BP meds: Cozaar  Current BP meds: none   Stable . Permissive hypertension (OK if < 220/120) but gradually normalize in 5-7 days  . Long-term BP goal normotensive  Hyperlipidemia  Home Lipid lowering medication: none  LDL - pending, goal < 70  Current lipid lowering medication: Lipitor 80 mg daily   Continue statin at discharge  Other Stroke Risk Factors  Advanced age  ETOH use, advised to drink no more than 1 alcoholic beverage per day  Newly diagnosed atrial fibrillation  Other Active Problems  Hypokalemia - 3.4 - supplemented ->4.0  Hyponatremia - 129->134  Leukocytosis - 22.2->17.1 (afebrile)  Newly diagnose atrial fibrillation - will need to start eliquis long term  Chronic lymphocytic  leukemia  Consider EEG  I have personally obtained history,examined this patient, reviewed notes, independently viewed imaging studies, participated in medical decision making and plan of care.ROS completed by me personally and pertinent positives fully documented  I have made any additions or clarifications directly to the above note.  She presented with a syncopal episodes due to rapid ventricular rate from new onset A. fib and was noted as having transient left hemiparesis when she recovered which has now resolved.  MRI is negative for acute stroke and CT angiogram shows no large vessel stenosis or occlusion.  Agree with anticoagulation with Eliquis for secondary stroke prevention and do not see any added benefit of aspirin which will only increase the bleeding risk without proven benefit.  Check lipid profile hemoglobin A1c.  Long discussion with the patient about atrial fibrillation and TIA and stroke risk and also about risk benefit of anticoagulation and answered questions.  Discussed with Dr. Einar Grad.  Greater than 50% time during this 80 minute consultation visit was spent on counseling and coordination of care about her TIA and new diagnosis of atrial fibrillation and stroke prevention  Antony Contras, MD Medical Director [facility] Pager: [phone] 10/14/2019 2:47 PM

## 2019-10-14 NOTE — Evaluation (Signed)
Occupational Therapy Evaluation Patient Details Name: Karen Bradford MRN: [id number] DOB: Jul 09, 1946 Today's Date: 10/14/2019    History of Present Illness Pt is a 73 yo female s/p syncopal episode with CT head is negative and MRI brain degenerative disc osteophyte at C3-4 with resultant moderate   Clinical Impression   Pt PTA: living with spouse and reports independence with ADL and mobility. Pt's focal deficit and L side facial droop, slow speech, but not slurred; R UE weakness 4-/5 MM grade versus RUE 4/5 MM grade, very minimal. Pt performing own toilet hygiene with independence and grooming at sink in standing with no physical assist. Deficits are not limiting pt at this time. Pt reports neuropathy in hands and feet and is seeing an OP PT for it. She has not been able to successfully exercise since~1 yr ago. Pt ambulating in room by lightly holding onto furniture, but not needing it for stability. Neurologist in room revealing TIA by new dx of Afib caused syncope. Pt does not require continued OT. OT signing off.       Follow Up Recommendations  No OT follow up;Supervision - Intermittent    Equipment Recommendations  None recommended by OT    Recommendations for Other Services       Precautions / Restrictions Precautions Precautions: Fall;Other (comment)(watch HR) Precaution Comments: watch HR Restrictions Weight Bearing Restrictions: No      Mobility Bed Mobility Overal bed mobility: Needs Assistance Bed Mobility: Sidelying to Sit;Sit to Sidelying   Sidelying to sit: Supervision     Sit to sidelying: Supervision    Transfers Overall transfer level: Needs assistance Equipment used: None Transfers: Sit to/from Stand Sit to Stand: Supervision              Balance Overall balance assessment: Needs assistance Sitting-balance support: Feet supported Sitting balance-Leahy Scale: Good       Standing balance-Leahy Scale: Fair Standing balance comment: lightly  using furniture with no LOB episodes                           ADL either performed or assessed with clinical judgement   ADL Overall ADL's : At baseline                                       General ADL Comments: Pt performing own toilet hygiene with independence and grooming at sink in standing with no physical assist.     Vision Baseline Vision/History: No visual deficits Vision Assessment?: No apparent visual deficits     Perception     Praxis      Pertinent Vitals/Pain Pain Assessment: No/denies pain     Hand Dominance Right   Extremity/Trunk Assessment Upper Extremity Assessment Upper Extremity Assessment: Generalized weakness;RUE deficits/detail;LUE deficits/detail RUE Deficits / Details: 4-/5 MM grade shoulder through digits LUE Deficits / Details: 4/5 MM grade in LUE       Cervical / Trunk Assessment Cervical / Trunk Assessment: Normal   Communication Communication Communication: Expressive difficulties(slow speech)   Cognition Arousal/Alertness: Awake/alert Behavior During Therapy: WFL for tasks assessed/performed Overall Cognitive Status: Within Functional Limits for tasks assessed                                 General Comments: Pt slow to respond at times, but no slurring  of speech noted.   General Comments  Pt reports neuropathy in hands and feet and is seeing an OP PT for it. She has not been able to successfully exercise since.    Exercises     Shoulder Instructions      Home Living Family/patient expects to be discharged to:: Private residence Living Arrangements: Spouse/significant other Available Help at Discharge: Family;Available 24 hours/day Type of Home: House Home Access: Stairs to enter [employer] of Steps: 4 Entrance Stairs-Rails: None Home Layout: Two level;Able to live on main level with bedroom/bathroom     Bathroom Shower/Tub: Occupational psychologist:  Standard     Home Equipment: Shower seat          Prior Functioning/Environment Level of Independence: Independent                 OT Problem List: Decreased activity tolerance;Impaired UE functional use      OT Treatment/Interventions:      OT Goals(Current goals can be found in the care plan section)    OT Frequency:     Barriers to D/C:            Co-evaluation              AM-PAC OT "6 Clicks" Daily Activity     Outcome Measure Help from another person eating meals?: None Help from another person taking care of personal grooming?: None Help from another person toileting, which includes using toliet, bedpan, or urinal?: None Help from another person bathing (including washing, rinsing, drying)?: A Little Help from another person to put on and taking off regular upper body clothing?: None Help from another person to put on and taking off regular lower body clothing?: None 6 Click Score: 23   End of Session Nurse Communication: Mobility status  Activity Tolerance: Patient tolerated treatment well Patient left: in bed;with call bell/phone within reach;with bed alarm set  OT Visit Diagnosis: Unsteadiness on feet (R26.81);Muscle weakness (generalized) (M62.81)                Time: 0930-1006 OT Time Calculation (min): 36 min Charges:  OT General Charges $OT Visit: 1 Visit OT Evaluation $OT Eval Moderate Complexity: 1 Mod OT Treatments $Self Care/Home Management : 8-22 mins  Karen Bradford) Karen Bradford OTR/L Acute Rehabilitation Services Pager: [phone] Office: [place] 10/14/2019, 11:55 AM

## 2019-10-14 NOTE — Progress Notes (Signed)
ANTICOAGULATION CONSULT NOTE - Initial Consult  Pharmacy Consult for Eliquis Indication: atrial fibrillation  Allergies  Allergen Reactions  . Colesevelam Rash    Patient Measurements: Height: 5\' 6"  (167.6 cm) Weight: 150 lb (68 kg) IBW/kg (Calculated) : 59.3 Heparin Dosing Weight:   Vital Signs: Temp: 98.1 F (36.7 C) (12/20 0632) Temp Source: Oral (12/20 [id number]) BP: 136/68 (12/20 0800) Pulse Rate: 61 (12/20 0800)  Labs: Recent Labs    10/13/19 2237 10/14/19 0052 10/14/19 0606  HGB 13.1  --  12.0  HCT 39.9  --  35.5*  PLT 411*  --  377  APTT 27  --   --   LABPROT 13.8  --   --   INR 1.1  --   --   CREATININE 0.82  --  0.64  TROPONINIHS 12 17  --     Estimated Creatinine Clearance: 56.9 mL/min (by C-G formula based on SCr of 0.64 mg/dL).   Medical History: Past Medical History:  Diagnosis Date  . Anxiety   . Cancer ([place])    breast  . Chronic lymphocytic leukemia ([place])   . Depression   . GERD (gastroesophageal reflux disease)   . Glaucoma   . Hypertension   . Lymphocytosis   . Polyneuropathic pain     Medications:  Scheduled:  . amiodarone  100 mg Oral Daily  . apixaban  5 mg Oral BID  . aspirin  325 mg Oral Daily  . atorvastatin  80 mg Oral q1800    Assessment: 73 yo female admitted for syncopal episode at home, experienced some facial drooping and thought to have TIA. MRI showed no acute intracranial abnormality. Patient found to have new onset atrial fibrillation. Patient was not taking anticoagulation PTA.   Scr 0.82>0.64, CrCl 56 mL. Hgb WNL at 12, Plt WNL at 377. She was receiving Lovenox 40 mg SQ daily for DVT prophylaxis and was started on aspirin 325 mg PO daily post-TIA.   Goal of Therapy:  Monitor platelets by anticoagulation protocol: Yes   Plan:  - Start Eliquis 5 mg PO twice daily this afternoon - Discontinue Lovenox DVT prophylaxis - Monitor for s/sx of bleeding especially while receiving high dose aspirin and Eliquis  Agnes Lawrence, PharmD PGY1 Pharmacy Resident

## 2019-10-14 NOTE — ED Notes (Signed)
[facility] notified (Jaime) - patient ready for transport 

## 2019-10-14 NOTE — ED Notes (Signed)
Called MRI for update regarding MRI; pt will be having MRI soon.

## 2019-10-14 NOTE — Consult Note (Signed)
Reason for Consult: New onset A. fib Referring Physician: [facility] hospitalist   Karen Bradford is an 73 y.o. female.  HPI: Patient is 73 year old white female with past medical history significant for chronic lymphocytic leukemia, hypertension, history of glaucoma denies, GERD, depression, peripheral neuropathy, history of carcinoma of the breast was admitted yesterday because of syncopal episode while having dinner with family and friends at home.  Patient denies any palpitations, lightheadednes prior to syncopal episode.  Denies any chest pain or shortness of breath strength dizzy and cold and saw last 2 nights prior to syncopal episode.  Denies such episodes in the past.  In the ED patient was noted to be in A. fib with controlled ventricular response and was noted to have left facial droop slurred speech.  Denies any weakness in the arms or legs denies any seizure activity.  CT of the brain and MRI negative for acute stroke.  2D echo done today showed normal LV systolic function with LVH and grade 2 diastolic dysfunction.  Patient subsequently has spontaneously converted back into sinus rhythm.  Past Medical History:  Diagnosis Date  . Anxiety   . Cancer ([name])    breast  . Chronic lymphocytic leukemia ([name])   . Depression   . GERD (gastroesophageal reflux disease)   . Glaucoma   . Hypertension   . Lymphocytosis   . Polyneuropathic pain     Past Surgical History:  Procedure Laterality Date  . ARTHRODESIS METATARSALPHALANGEAL JOINT (MTPJ) Right 08/11/2017   Procedure: Right Hallux Metatarsal Phalangeal Joint Arthrodesis; Excision of Bunionette;  Surgeon: Wylene Simmer, MD;  Location: [facility];  Service: Orthopedics;  Laterality: Right;  . BREAST SURGERY     mastectomy  . GLAUCOMA SURGERY      Family History  Problem Relation Age of Onset  . Stroke Mother   . Brain cancer Father     Social History:  reports that she has never smoked. She has never used smokeless  tobacco. She reports current alcohol use of about 2.0 standard drinks of alcohol per week. She reports that she does not use drugs.  Allergies:  Allergies  Allergen Reactions  . Colesevelam Rash    Medications: I have reviewed the patient's current medications.  Results for orders placed or performed during the hospital encounter of 10/13/19 (from the past 48 hour(s))  CBG monitoring, ED     Status: Abnormal   Collection Time: 10/13/19 10:18 PM  Result Value Ref Range   Glucose-Capillary 137 (H) 70 - 99 mg/dL  Basic metabolic panel     Status: Abnormal   Collection Time: 10/13/19 10:37 PM  Result Value Ref Range   Sodium 129 (L) 135 - 145 mmol/L   Potassium 3.4 (L) 3.5 - 5.1 mmol/L   Chloride 95 (L) 98 - 111 mmol/L   CO2 23 22 - 32 mmol/L   Glucose, Bld 159 (H) 70 - 99 mg/dL   BUN 13 8 - 23 mg/dL   Creatinine, Ser 0.82 0.44 - 1.00 mg/dL   Calcium 10.1 8.9 - 10.3 mg/dL   GFR calc non Af Amer >60 >60 mL/min   GFR calc Af Amer >60 >60 mL/min   Anion gap 11 5 - 15    Comment: Performed at [facility], [address] [place], [place] [address]  CBC     Status: Abnormal   Collection Time: 10/13/19 10:37 PM  Result Value Ref Range   WBC 22.2 (H) 4.0 - 10.5 K/uL  RBC 4.41 3.87 - 5.11 MIL/uL   Hemoglobin 13.1 12.0 - 15.0 g/dL   HCT 39.9 36.0 - 46.0 %   MCV 90.5 80.0 - 100.0 fL   MCH 29.7 26.0 - 34.0 pg   MCHC 32.8 30.0 - 36.0 g/dL   RDW 12.8 11.5 - 15.5 %   Platelets 411 (H) 150 - 400 K/uL   nRBC 0.2 0.0 - 0.2 %    Comment: Performed at [facility], [address] [place], [place] [address]  Troponin I (High Sensitivity)     Status: None   Collection Time: 10/13/19 10:37 PM  Result Value Ref Range   Troponin I (High Sensitivity) 12 <18 ng/L    Comment: (NOTE) Elevated high sensitivity troponin I (hsTnI) values and significant  changes across serial measurements may suggest ACS but many other  chronic and acute conditions are known to elevate hsTnI  results.  Refer to the "Links" section for chest pain algorithms and additional  guidance. Performed at [facility], [address] [place], [place] [address]   Ethanol     Status: None   Collection Time: 10/13/19 10:37 PM  Result Value Ref Range   Alcohol, Ethyl (B) <10 <10 mg/dL    Comment: (NOTE) Lowest detectable limit for serum alcohol is 10 mg/dL. For medical purposes only. Performed at [facility], [address] [place], [place] [address]   Protime-INR     Status: None   Collection Time: 10/13/19 10:37 PM  Result Value Ref Range   Prothrombin Time 13.8 11.4 - 15.2 seconds   INR 1.1 0.8 - 1.2    Comment: (NOTE) INR goal varies based on device and disease states. Performed at [facility], [address] [place], [place] [address]   APTT     Status: None   Collection Time: 10/13/19 10:37 PM  Result Value Ref Range   aPTT 27 24 - 36 seconds    Comment: Performed at [facility], [address] [place], [place] [address]  Differential     Status: Abnormal   Collection Time: 10/13/19 10:37 PM  Result Value Ref Range   Neutrophils Relative % 34 %   Neutro Abs 7.5 1.7 - 7.7 K/uL   Lymphocytes Relative 57 %   Lymphs Abs 12.9 (H) 0.7 - 4.0 K/uL   Monocytes Relative 7 %   Monocytes Absolute 1.4 (H) 0.1 - 1.0 K/uL   Eosinophils Relative 1 %   Eosinophils Absolute 0.2 0.0 - 0.5 K/uL   Basophils Relative 1 %   Basophils Absolute 0.1 0.0 - 0.1 K/uL   RBC Morphology MORPHOLOGY UNREMARKABLE    Smear Review Normal platelet morphology    Immature Granulocytes 0 %   Abs Immature Granulocytes 0.07 0.00 - 0.07 K/uL   Smudge Cells PRESENT     Comment: Performed at [facility], [address] [place], [place] [address]  Urinalysis, Routine w reflex microscopic     Status: Abnormal   Collection Time: 10/13/19 11:01 PM  Result Value Ref Range   Color, Urine STRAW (A) YELLOW   APPearance CLEAR CLEAR   Specific  Gravity, Urine <1.005 (L) 1.005 - 1.030   pH 7.0 5.0 - 8.0   Glucose, UA NEGATIVE NEGATIVE mg/dL   Hgb urine dipstick TRACE (A) NEGATIVE   Bilirubin Urine NEGATIVE NEGATIVE   Ketones, ur NEGATIVE NEGATIVE mg/dL   Protein, ur 100 (A)  NEGATIVE mg/dL   Nitrite NEGATIVE NEGATIVE   Leukocytes,Ua NEGATIVE NEGATIVE    Comment: Performed at [facility], [address] [place], [place] [address]  Urinalysis, Microscopic (reflex)     Status: None   Collection Time: 10/13/19 11:01 PM  Result Value Ref Range   RBC / HPF 0-5 0 - 5 RBC/hpf   WBC, UA 0-5 0 - 5 WBC/hpf   Bacteria, UA NONE SEEN NONE SEEN   Squamous Epithelial / LPF 0-5 0 - 5   Hyaline Casts, UA PRESENT    Granular Casts, UA PRESENT     Comment: Performed at [facility], [address] [place], [place] [address]  Urine rapid drug screen (hosp performed)     Status: None   Collection Time: 10/13/19 11:02 PM  Result Value Ref Range   Opiates NONE DETECTED NONE DETECTED   Cocaine NONE DETECTED NONE DETECTED   Benzodiazepines NONE DETECTED NONE DETECTED   Amphetamines NONE DETECTED NONE DETECTED   Tetrahydrocannabinol NONE DETECTED NONE DETECTED   Barbiturates NONE DETECTED NONE DETECTED    Comment: (NOTE) DRUG SCREEN FOR MEDICAL PURPOSES ONLY.  IF CONFIRMATION IS NEEDED FOR ANY PURPOSE, NOTIFY LAB WITHIN 5 DAYS. LOWEST DETECTABLE LIMITS FOR URINE DRUG SCREEN Drug Class                     Cutoff (ng/mL) Amphetamine and metabolites    1000 Barbiturate and metabolites    200 Benzodiazepine                 937 Tricyclics and metabolites     300 Opiates and metabolites        300 Cocaine and metabolites        300 THC                            50 Performed at [facility], [address] [place], [place] [address]   SARS Coronavirus 2 Ag (30 min TAT) - Nasal Swab (BD Veritor Kit)     Status: None   Collection Time: 10/13/19 11:02 PM   Specimen: Nasal Swab (BD Veritor Kit)  Result Value  Ref Range   SARS Coronavirus 2 Ag NEGATIVE NEGATIVE    Comment: (NOTE) SARS-CoV-2 antigen NOT DETECTED.  Negative results are presumptive.  Negative results do not preclude SARS-CoV-2 infection and should not be used as the sole basis for treatment or other patient management decisions, including infection  control decisions, particularly in the presence of clinical signs and  symptoms consistent with COVID-19, or in those who have been in contact with the virus.  Negative results must be combined with clinical observations, patient history, and epidemiological information. The expected result is Negative. Fact Sheet for Patients: PodPark.tn Fact Sheet for Healthcare Providers: GiftContent.is This test is not yet approved or cleared by the [place] FDA and  has been authorized for detection and/or diagnosis of SARS-CoV-2 by FDA under an Emergency Use Authorization (EUA).  This EUA will remain in effect (meaning this test can be used) for the duration of  the COVID-19 de claration under Section 564(b)(1) of the Act, 21 U.S.C. section 360bbb-3(b)(1), unless the authorization is terminated or revoked sooner. Performed at [facility], [address] [place], [place] [address]   Troponin I (High Sensitivity)     Status: None   Collection Time: 10/14/19 12:52 AM  Result Value Ref Range   Troponin I (High Sensitivity) 17 <18 ng/L    Comment: (NOTE) Elevated high sensitivity troponin I (hsTnI) values and significant  changes across serial measurements may suggest ACS but many other  chronic and acute conditions are known to elevate hsTnI results.  Refer to the "Links" section for chest pain algorithms and additional  guidance. Performed at [facility], [address] [place], [place] [address]   CBC with Differential/Platelet     Status: Abnormal   Collection Time: 10/14/19  6:06 AM  Result Value  Ref Range   WBC 17.1 (H) 4.0 - 10.5 K/uL   RBC 3.91 3.87 - 5.11 MIL/uL   Hemoglobin 12.0 12.0 - 15.0 g/dL   HCT 35.5 (L) 36.0 - 46.0 %   MCV 90.8 80.0 - 100.0 fL   MCH 30.7 26.0 - 34.0 pg   MCHC 33.8 30.0 - 36.0 g/dL   RDW 13.0 11.5 - 15.5 %   Platelets 377 150 - 400 K/uL   nRBC 0.0 0.0 - 0.2 %   Neutrophils Relative % 18 %   Neutro Abs 3.1 1.7 - 7.7 K/uL   Lymphocytes Relative 76 %   Lymphs Abs 13.2 (H) 0.7 - 4.0 K/uL   Monocytes Relative 4 %   Monocytes Absolute 0.6 0.1 - 1.0 K/uL   Eosinophils Relative 1 %   Eosinophils Absolute 0.1 0.0 - 0.5 K/uL   Basophils Relative 1 %   Basophils Absolute 0.1 0.0 - 0.1 K/uL   Immature Granulocytes 0 %   Abs Immature Granulocytes 0.02 0.00 - 0.07 K/uL   Smudge Cells PRESENT     Comment: Performed at [facility], [address], [place], [place] [address]  Osmolality     Status: None   Collection Time: 10/14/19  6:06 AM  Result Value Ref Range   Osmolality 285 275 - 295 mOsm/kg    Comment: Performed at [facility] [address] [place], [place] [address]  Basic metabolic panel     Status: Abnormal   Collection Time: 10/14/19  6:06 AM  Result Value Ref Range   Sodium 134 (L) 135 - 145 mmol/L   Potassium 4.0 3.5 - 5.1 mmol/L   Chloride 99 98 - 111 mmol/L   CO2 27 22 - 32 mmol/L   Glucose, Bld 112 (H) 70 - 99 mg/dL   BUN 11 8 - 23 mg/dL   Creatinine, Ser 0.64 0.44 - 1.00 mg/dL   Calcium 9.5 8.9 - 10.3 mg/dL   GFR calc non Af Amer >60 >60 mL/min   GFR calc Af Amer >60 >60 mL/min   Anion gap 8 5 - 15    Comment: Performed at [facility], [address] [place], [place] [address]  Magnesium     Status: None   Collection Time: 10/14/19  6:06 AM  Result Value Ref Range   Magnesium 1.8 1.7 - 2.4 mg/dL    Comment: Performed at [facility] [address], [place], [place] [address]    CT Angio Head W or Wo Contrast  Result Date: 10/13/2019 CLINICAL DATA:  Initial evaluation for acute dizziness,  syncope. EXAM: CT ANGIOGRAPHY HEAD AND NECK TECHNIQUE: Multidetector CT imaging of the head and neck was performed using the standard protocol during bolus administration of intravenous contrast. Multiplanar CT image reconstructions and MIPs were obtained to evaluate the vascular anatomy. Carotid stenosis measurements (when applicable) are obtained utilizing NASCET  criteria, using the distal internal carotid diameter as the denominator. CONTRAST:  136m OMNIPAQUE IOHEXOL 350 MG/ML SOLN COMPARISON:  Prior head CT from earlier same day. FINDINGS: CTA NECK FINDINGS Aortic arch: Visualized aortic arch of normal caliber with normal branch pattern. Scattered atheromatous plaque noted about the arch itself. No hemodynamically significant stenosis seen about the origin the great vessels. Visualized subclavian arteries widely patent. Right carotid system: Right common carotid artery widely patent from its origin to the bifurcation without stenosis. Mild atheromatous irregularity about the right bifurcation without stenosis. Right ICA widely patent distally to the skull base without stenosis, dissection or occlusion. Left carotid system: Left CCA widely patent from its origin to the bifurcation without stenosis. Mild atheromatous irregularity about the left bifurcation without stenosis. Left ICA widely patent distally to the skull base without stenosis, dissection or occlusion. Vertebral arteries: Both vertebral arteries arise from subclavian arteries. Left vertebral artery slightly dominant, with a diffusely hypoplastic right vertebral artery. Focal plaque at the origin left vertebral artery with no more than mild stenosis. Vertebral arteries otherwise widely patent within the neck without stenosis dissection or occlusion. Skeleton: No acute osseous abnormality. No discrete osseous lesions. Moderate cervical spondylosis noted at C3-4 through C6-7. Other neck: No other acute soft tissue abnormality within the neck. No mass  lesion or adenopathy. Few scattered subcentimeter hypodense thyroid nodules noted, for which no follow-up imaging recommended given size and patient age. Upper chest: Visualized upper chest demonstrates no acute finding. Review of the MIP images confirms the above findings CTA HEAD FINDINGS Anterior circulation: Petrous segments widely patent bilaterally. Mild atheromatous change within the carotid siphons without significant stenosis. A1 segments patent bilaterally. Normal anterior communicating artery complex. Anterior cerebral arteries widely patent or distal aspects without stenosis. No M1 stenosis or occlusion. Normal MCA bifurcations. Distal MCA branches well perfused and symmetric. Posterior circulation: Vertebral arteries widely patent to the vertebrobasilar junction without stenosis. Posterior inferior cerebral arteries patent bilaterally. Basilar widely patent to its distal aspect. Superior cerebral arteries patent bilaterally. Left PCA primarily supplied via the basilar. Fetal type origin of the right PCA. Both PCAs widely patent to their distal aspects. Venous sinuses: Patent. Anatomic variants: Fetal type origin right PCA. No intracranial aneurysm. Review of the MIP images confirms the above findings IMPRESSION: 1. Negative CTA for emergent large vessel occlusion. 2. Mild atheromatous change about the aortic arch, carotid bifurcations, and carotid siphons without hemodynamically significant or correctable stenosis. Electronically Signed   By: BJeannine BogaM.D.   On: 10/13/2019 23:22   CT Angio Neck W and/or Wo Contrast  Result Date: 10/13/2019 CLINICAL DATA:  Initial evaluation for acute dizziness, syncope. EXAM: CT ANGIOGRAPHY HEAD AND NECK TECHNIQUE: Multidetector CT imaging of the head and neck was performed using the standard protocol during bolus administration of intravenous contrast. Multiplanar CT image reconstructions and MIPs were obtained to evaluate the vascular anatomy. Carotid  stenosis measurements (when applicable) are obtained utilizing NASCET criteria, using the distal internal carotid diameter as the denominator. CONTRAST:  1030mOMNIPAQUE IOHEXOL 350 MG/ML SOLN COMPARISON:  Prior head CT from earlier same day. FINDINGS: CTA NECK FINDINGS Aortic arch: Visualized aortic arch of normal caliber with normal branch pattern. Scattered atheromatous plaque noted about the arch itself. No hemodynamically significant stenosis seen about the origin the great vessels. Visualized subclavian arteries widely patent. Right carotid system: Right common carotid artery widely patent from its origin to the bifurcation without stenosis. Mild atheromatous irregularity about the right bifurcation without stenosis. Right ICA  widely patent distally to the skull base without stenosis, dissection or occlusion. Left carotid system: Left CCA widely patent from its origin to the bifurcation without stenosis. Mild atheromatous irregularity about the left bifurcation without stenosis. Left ICA widely patent distally to the skull base without stenosis, dissection or occlusion. Vertebral arteries: Both vertebral arteries arise from subclavian arteries. Left vertebral artery slightly dominant, with a diffusely hypoplastic right vertebral artery. Focal plaque at the origin left vertebral artery with no more than mild stenosis. Vertebral arteries otherwise widely patent within the neck without stenosis dissection or occlusion. Skeleton: No acute osseous abnormality. No discrete osseous lesions. Moderate cervical spondylosis noted at C3-4 through C6-7. Other neck: No other acute soft tissue abnormality within the neck. No mass lesion or adenopathy. Few scattered subcentimeter hypodense thyroid nodules noted, for which no follow-up imaging recommended given size and patient age. Upper chest: Visualized upper chest demonstrates no acute finding. Review of the MIP images confirms the above findings CTA HEAD FINDINGS Anterior  circulation: Petrous segments widely patent bilaterally. Mild atheromatous change within the carotid siphons without significant stenosis. A1 segments patent bilaterally. Normal anterior communicating artery complex. Anterior cerebral arteries widely patent or distal aspects without stenosis. No M1 stenosis or occlusion. Normal MCA bifurcations. Distal MCA branches well perfused and symmetric. Posterior circulation: Vertebral arteries widely patent to the vertebrobasilar junction without stenosis. Posterior inferior cerebral arteries patent bilaterally. Basilar widely patent to its distal aspect. Superior cerebral arteries patent bilaterally. Left PCA primarily supplied via the basilar. Fetal type origin of the right PCA. Both PCAs widely patent to their distal aspects. Venous sinuses: Patent. Anatomic variants: Fetal type origin right PCA. No intracranial aneurysm. Review of the MIP images confirms the above findings IMPRESSION: 1. Negative CTA for emergent large vessel occlusion. 2. Mild atheromatous change about the aortic arch, carotid bifurcations, and carotid siphons without hemodynamically significant or correctable stenosis. Electronically Signed   By: Jeannine Boga M.D.   On: 10/13/2019 23:22   MR BRAIN WO CONTRAST  Result Date: 10/14/2019 CLINICAL DATA:  Initial evaluation for transient ischemic attack, sudden onset dizziness. EXAM: MRI HEAD WITHOUT CONTRAST TECHNIQUE: Multiplanar, multiecho pulse sequences of the brain and surrounding structures were obtained without intravenous contrast. COMPARISON:  Prior CT and CTA from 10/13/2019. FINDINGS: Brain: Mild age-related cerebral atrophy. Minimal patchy T2/FLAIR hyperintensity within the periventricular and deep white matter, most consistent with chronic small vessel ischemic disease, minimal for age. No abnormal foci of restricted diffusion to suggest acute or subacute ischemia. Gray-white matter differentiation maintained. No encephalomalacia  to suggest chronic cortical infarction. No foci of susceptibility artifact to suggest acute or chronic intracranial hemorrhage. No mass lesion, midline shift or mass effect. No hydrocephalus. No extra-axial fluid collection. Pituitary gland suprasellar region normal. Midline structures intact. Vascular: Major intracranial vascular flow voids are maintained. Skull and upper cervical spine: Craniocervical junction within normal limits. Degenerative disc osteophyte noted at C3-4 with resultant moderate spinal stenosis. Bone marrow signal intensity within normal limits. No scalp soft tissue abnormality. Sinuses/Orbits: Patient status post bilateral ocular lens replacement. Globes and orbital soft tissues demonstrate no acute finding. Paranasal sinuses are clear. Trace bilateral mastoid effusions, of doubtful significance. Other: None. IMPRESSION: 1. No acute intracranial abnormality. 2. Age-related cerebral atrophy with mild chronic small vessel ischemic disease. 3. Degenerative disc osteophyte at C3-4 with resultant moderate spinal stenosis. Finding could be further assessed with dedicated MRI of the cervical spine as clinically desired. Electronically Signed   By: Pincus Badder.D.  On: 10/14/2019 03:54   DG Chest Portable 1 View  Result Date: 10/13/2019 CLINICAL DATA:  Infectious workup. Sudden onset of dizziness leading to syncope. EXAM: PORTABLE CHEST 1 VIEW COMPARISON:  Lung apices from neck CTA earlier this day. FINDINGS: Heart is normal in size. Aortic tortuosity, otherwise normal mediastinal contours. Aortic atherosclerosis. Pulmonary vasculature is normal. No consolidation, pleural effusion, or pneumothorax. No acute osseous abnormalities are seen. IMPRESSION: 1. No acute abnormality.  No evidence of intrathoracic infection. 2.  Aortic Atherosclerosis (ICD10-I70.0). Electronically Signed   By: Keith Rake M.D.   On: 10/13/2019 23:36   ECHOCARDIOGRAM COMPLETE  Result Date: 10/14/2019    ECHOCARDIOGRAM REPORT   Patient Name:   VENOLA CASTELLO Date of Exam: 10/14/2019 Medical Rec #:  [id number]   Height:       66.0 in Accession #:    [id number]  Weight:       150.0 lb Date of Birth:  1946/02/13   BSA:          1.77 m Patient Age:    57 years    BP:           136/72 mmHg Patient Gender: F           HR:           56 bpm. Exam Location:  Inpatient Procedure: 2D Echo, Cardiac Doppler and Color Doppler Indications:    I48.91* Unspeicified atrial fibrillation. TIA.  History:        Patient has no prior history of Echocardiogram examinations.                 TIA, Arrythmias:Atrial Fibrillation; Signs/Symptoms:Syncope.  Sonographer:    Roseanna Rainbow RDCS Referring Phys: [id number] [place]  1. Left ventricular ejection fraction, by visual estimation, is 60 to 65%. The left ventricle has normal function. Left ventricular septal wall thickness was mildly increased. There is mildly increased left ventricular hypertrophy.  2. Left ventricular diastolic parameters are consistent with Grade II diastolic dysfunction (pseudonormalization).  3. The left ventricle has no regional wall motion abnormalities.  4. Global right ventricle has normal systolic function.The right ventricular size is normal. No increase in right ventricular wall thickness.  5. Left atrial size was normal.  6. Right atrial size was normal.  7. The mitral valve is normal in structure. Trivial mitral valve regurgitation.  8. The tricuspid valve is normal in structure. Tricuspid valve regurgitation is trivial.  9. The aortic valve is normal in structure. Aortic valve regurgitation is not visualized. 10. The pulmonic valve was grossly normal. Pulmonic valve regurgitation is not visualized. 11. Moderately elevated pulmonary artery systolic pressure. 12. The inferior vena cava is normal in size with greater than 50% respiratory variability, suggesting right atrial pressure of 3 mmHg. 13. The interatrial septum was not assessed. FINDINGS  Left  Ventricle: Left ventricular ejection fraction, by visual estimation, is 60 to 65%. The left ventricle has normal function. The left ventricle has no regional wall motion abnormalities. There is mildly increased left ventricular hypertrophy. Left ventricular diastolic parameters are consistent with Grade II diastolic dysfunction (pseudonormalization). Right Ventricle: The right ventricular size is normal. No increase in right ventricular wall thickness. Global RV systolic function is has normal systolic function. The tricuspid regurgitant velocity is 2.96 m/s, and with an assumed right atrial pressure  of 8 mmHg, the estimated right ventricular systolic pressure is moderately elevated at 43.0 mmHg. Left Atrium: Left atrial size was normal in size. Right Atrium: Right atrial  size was normal in size Pericardium: There is no evidence of pericardial effusion. Mitral Valve: The mitral valve is normal in structure. Trivial mitral valve regurgitation. MV peak gradient, 6.2 mmHg. Tricuspid Valve: The tricuspid valve is normal in structure. Tricuspid valve regurgitation is trivial. Aortic Valve: The aortic valve is normal in structure. Aortic valve regurgitation is not visualized. Pulmonic Valve: The pulmonic valve was grossly normal. Pulmonic valve regurgitation is not visualized. Pulmonic regurgitation is not visualized. Aorta: The aortic root is normal in size and structure. Venous: The inferior vena cava is normal in size with greater than 50% respiratory variability, suggesting right atrial pressure of 3 mmHg. IAS/Shunts: The interatrial septum was not assessed.  LEFT VENTRICLE PLAX 2D LVIDd:         3.60 cm       Diastology LVIDs:         2.30 cm       LV e' lateral:   5.08 cm/s LV PW:         1.50 cm       LV E/e' lateral: 25.6 LV IVS:        1.30 cm       LV e' medial:    6.18 cm/s LVOT diam:     1.90 cm       LV E/e' medial:  21.0 LV SV:         36 ml LV SV Index:   20.31 LVOT Area:     2.84 cm  LV Volumes (MOD) LV  area d, A2C:    20.70 cm LV area d, A4C:    22.00 cm LV area s, A2C:    12.00 cm LV area s, A4C:    12.30 cm LV major d, A2C:   6.62 cm LV major d, A4C:   6.78 cm LV major s, A2C:   5.61 cm LV major s, A4C:   5.76 cm LV vol d, MOD A2C: 53.1 ml LV vol d, MOD A4C: 58.6 ml LV vol s, MOD A2C: 21.2 ml LV vol s, MOD A4C: 22.6 ml LV SV MOD A2C:     31.9 ml LV SV MOD A4C:     58.6 ml LV SV MOD BP:      34.2 ml RIGHT VENTRICLE            IVC RV S prime:     9.59 cm/s  IVC diam: 2.50 cm TAPSE (M-mode): 1.8 cm LEFT ATRIUM             Index       RIGHT ATRIUM           Index LA diam:        3.40 cm 1.92 cm/m  RA Area:     15.10 cm LA Vol (A2C):   38.4 ml 21.70 ml/m RA Volume:   34.20 ml  19.33 ml/m LA Vol (A4C):   43.8 ml 24.75 ml/m LA Biplane Vol: 43.6 ml 24.64 ml/m  AORTIC VALVE LVOT Vmax:   73.80 cm/s LVOT Vmean:  46.000 cm/s LVOT VTI:    0.140 m  AORTA Ao Root diam: 2.80 cm Ao Asc diam:  3.30 cm MITRAL VALVE                         TRICUSPID VALVE MV Area (PHT): 3.21 cm              TR Peak grad:   35.0 mmHg MV Peak  grad:  6.2 mmHg              TR Vmax:        296.00 cm/s MV Mean grad:  2.0 mmHg MV Vmax:       1.25 m/s              SHUNTS MV Vmean:      68.8 cm/s             Systemic VTI:  0.14 m MV VTI:        0.42 m                Systemic Diam: 1.90 cm MV PHT:        68.44 msec MV Decel Time: 236 msec MV E velocity: 130.00 cm/s 103 cm/s MV A velocity: 40.40 cm/s  70.3 cm/s MV E/A ratio:  3.22        1.5  Charolette Forward MD Electronically signed by Charolette Forward MD Signature Date/Time: 10/14/2019/10:42:41 AM    Final    CT HEAD CODE STROKE WO CONTRAST  Result Date: 10/13/2019 CLINICAL DATA:  Code stroke. Initial evaluation for acute dizziness, syncope. EXAM: CT HEAD WITHOUT CONTRAST TECHNIQUE: Contiguous axial images were obtained from the base of the skull through the vertex without intravenous contrast. COMPARISON:  Prior head CT from 09/10/2005. FINDINGS: Brain: Generalized age-related cerebral atrophy  with mild chronic small vessel ischemic disease. No acute intracranial hemorrhage. No acute large vessel territory infarct. No mass lesion, midline shift or mass effect. No hydrocephalus. No extra-axial fluid collection. Small dilated perivascular space noted at the inferior right basal ganglia. Vascular: No hyperdense vessel. Skull: Scalp soft tissues and calvarium within normal limits. Sinuses/Orbits: Globes orbital soft tissues normal. Paranasal sinuses mastoid air cells are clear. Other: None. ASPECTS [facility] Stroke Program Early CT Score) - Ganglionic level infarction (caudate, lentiform nuclei, internal capsule, insula, M1-M3 cortex): 7 - Supraganglionic infarction (M4-M6 cortex): 3 Total score (0-10 with 10 being normal): 10 IMPRESSION: 1. No acute intracranial infarct or other abnormality identified. 2. ASPECTS is 10. 3. Age-related cerebral atrophy with mild chronic small vessel ischemic disease. Critical Value/emergent results were called by telephone at the time of interpretation on 10/13/2019 at 10:42 pm to providerMATTHEW TRIFAN , who verbally acknowledged these results. Electronically Signed   By: Jeannine Boga M.D.   On: 10/13/2019 22:43    Review of Systems  Constitutional: Negative for appetite change, fatigue and fever.  HENT: Negative for nosebleeds.   Respiratory: Negative for shortness of breath.   Cardiovascular: Negative for chest pain and palpitations.  Gastrointestinal: Negative for abdominal pain.  Endocrine: Negative for cold intolerance and heat intolerance.  Genitourinary: Negative for difficulty urinating.  Neurological: Positive for dizziness, syncope and facial asymmetry. Negative for seizures and headaches.   Blood pressure 136/68, pulse 61, temperature 98.1 F (36.7 C), temperature source Oral, resp. rate 16, height _0  (1.676 m), weight 68 kg, SpO2 99 %. Physical Exam  Constitutional: She is oriented to person, place, and time. She appears well-developed  and well-nourished.  HENT:  Head: Normocephalic and atraumatic.  Eyes: Pupils are equal, round, and reactive to light. Conjunctivae are normal. Left eye exhibits no discharge. No scleral icterus.  Neck: No JVD present. No tracheal deviation present. No thyromegaly present.  Cardiovascular: Normal rate and regular rhythm. Exam reveals no gallop and no friction rub.  No murmur heard. Respiratory: Effort normal and breath sounds normal. No respiratory distress. She has no wheezes. She has  no rales.  GI: Soft. Bowel sounds are normal. She exhibits no distension. There is no abdominal tenderness. There is no rebound.  Musculoskeletal:        General: No tenderness, deformity or edema.     Cervical back: Normal range of motion and neck supple.  Neurological: She is alert and oriented to person, place, and time.  Left facial droop noted    Assessment/Plan: New onset status post paroxysmal A. fib with controlled ventricular response chads vas score of 5 Status post TIA probably cardioembolic Hypertension Chronic lymphoid leukemia GERD Depression History of breast carcinoma Peripheral neuropathy Hyperlipidemia Plan Start Eliquis 5 mg twice daily We will start very low-dose amiodarone 100 mg daily hopefully will keep her in sinus rhythm Check EKG/lipid panel/TSH Agree with high-dose statins  Charolette Forward 10/14/2019, 10:57 AM

## 2019-10-14 NOTE — ED Notes (Signed)
Pt has her eye drops from home and is self administering per [facility]OR[facility] from Dr. Langston Masker

## 2019-10-14 NOTE — Progress Notes (Signed)
Pt refused to take some meds. Pt stated she does not take xanax at home and she has never taken liptor. Pt educated that med was started per provider order but she communicated the provider hasn't spoken to her about taking Lipitor. Provider paged to discuss new meds with patient.

## 2019-10-14 NOTE — Evaluation (Signed)
Physical Therapy Evaluation Patient Details Name: Karen Bradford MRN: [id number] DOB: [birth year] Today's Date: 10/14/2019   History of Present Illness   Patient is 73 year old white female with past medical history significant for chronic lymphocytic leukemia, hypertension, history of glaucoma denies, GERD, depression, peripheral neuropathy, history of carcinoma of the breast was admitted yesterday because of syncopal episode. Pt was in A-fib in ED and converted back to sinus rhythm. Pt also had R side weakness and facial droop. Head CT neg.   Clinical Impression  Patient evaluated by Physical Therapy with no further acute PT needs identified. All education has been completed and the patient has no further questions. Pt with balance deficits due to BLE neuropathy but appears to be at baseline LOF. Ambulated >200' without AD with supervision and no LOB. Practiced 3 stairs with alternating pattern with rail. Pt has been going to outpt PT for neuropathy, recommend continuing this but no further acute needs.  See below for any follow-up Physical Therapy or equipment needs. PT is signing off. Thank you for this referral.     Follow Up Recommendations Outpatient PT (resume for balance)    Equipment Recommendations  None recommended by PT    Recommendations for Other Services       Precautions / Restrictions Precautions Precautions: Fall;Other (comment)(watch HR) Precaution Comments: watch HR, neuropathy B feet Restrictions Weight Bearing Restrictions: No      Mobility  Bed Mobility Overal bed mobility: Modified Independent Bed Mobility: Supine to Sit           General bed mobility comments: pt able to sit up in bed and swing LE's off side without assist  Transfers Overall transfer level: Needs assistance Equipment used: None Transfers: Sit to/from Stand Sit to Stand: Supervision         General transfer comment: pt with sufficient strength to stand, no  LOB  Ambulation/Gait Ambulation/Gait assistance: Supervision Gait Distance (Feet): 200 Feet Assistive device: None Gait Pattern/deviations: Step-through pattern Gait velocity: decreased Gait velocity interpretation: 1.31 - 2.62 ft/sec, indicative of limited community ambulator General Gait Details: pt with slightly guarded gait and decreased arm swing, she reports this as her normal pattern  Stairs Stairs: Yes Stairs assistance: Supervision Stair Management: One rail Right;Alternating pattern;Forwards Number of Stairs: 3 General stair comments: pt able to use alternating pattern, rail for safety (pt could hold husband's hand at home if needed)  Wheelchair Mobility    Modified Rankin (Stroke Patients Only) Modified Rankin (Stroke Patients Only) Pre-Morbid Rankin Score: No symptoms Modified Rankin: No significant disability     Balance Overall balance assessment: Needs assistance Sitting-balance support: Feet supported Sitting balance-Leahy Scale: Good     Standing balance support: No upper extremity supported Standing balance-Leahy Scale: Fair Standing balance comment: lightly using furniture with no LOB episodes                             Pertinent Vitals/Pain Pain Assessment: No/denies pain    Home Living Family/patient expects to be discharged to:: Private residence Living Arrangements: Spouse/significant other Available Help at Discharge: Family;Available 24 hours/day Type of Home: House Home Access: Stairs to enter Entrance Stairs-Rails: None Entrance Stairs-Number of Steps: 4 Home Layout: Two level;Able to live on main level with bedroom/bathroom Home Equipment: Shower seat Additional Comments: pt's husband is home with her most of the time    Prior Function Level of Independence: Independent         Comments: has  been going to outpt PT for neuropathy in her feet and related balance issues     Hand Dominance   Dominant Hand: Right     Extremity/Trunk Assessment   Upper Extremity Assessment Upper Extremity Assessment: Defer to OT evaluation    Lower Extremity Assessment Lower Extremity Assessment: Overall WFL for tasks assessed    Cervical / Trunk Assessment Cervical / Trunk Assessment: Normal  Communication   Communication: Expressive difficulties(slow speech)  Cognition Arousal/Alertness: Awake/alert Behavior During Therapy: WFL for tasks assessed/performed Overall Cognitive Status: Within Functional Limits for tasks assessed                                 General Comments: Pt slow to respond at times, but no slurring of speech noted.      General Comments General comments (skin integrity, edema, etc.): discussed exercise in water due to neuropathy and pt agreeable to trying this. Also discussed use of AD if becoming more reliant on furniture during gait    Exercises     Assessment/Plan    PT Assessment All further PT needs can be met in the next venue of care  PT Problem List Decreased balance;Decreased mobility       PT Treatment Interventions      PT Goals (Current goals can be found in the Care Plan section)  Acute Rehab PT Goals Patient Stated Goal: return home PT Goal Formulation: All assessment and education complete, DC therapy    Frequency     Barriers to discharge        Co-evaluation               AM-PAC PT "6 Clicks" Mobility  Outcome Measure Help needed turning from your back to your side while in a flat bed without using bedrails?: None Help needed moving from lying on your back to sitting on the side of a flat bed without using bedrails?: None Help needed moving to and from a bed to a chair (including a wheelchair)?: None Help needed standing up from a chair using your arms (e.g., wheelchair or bedside chair)?: None Help needed to walk in hospital room?: None Help needed climbing 3-5 steps with a railing? : None 6 Click Score: 24    End of Session  Equipment Utilized During Treatment: Gait belt Activity Tolerance: Patient tolerated treatment well Patient left: in chair;with call bell/phone within reach Nurse Communication: Mobility status PT Visit Diagnosis: Unsteadiness on feet (R26.81)    Time: 4239-5320 PT Time Calculation (min) (ACUTE ONLY): 20 min   Charges:   PT Evaluation $PT Eval Moderate Complexity: [place]  Pager [phone] Office [place] 10/14/2019, 1:33 PM

## 2019-10-14 NOTE — Progress Notes (Signed)
  Echocardiogram 2D Echocardiogram has been performed.  Karen Bradford 10/14/2019, 9:33 AM

## 2019-10-14 NOTE — Progress Notes (Signed)
  Speech Language Pathology Treatment:    Patient Details Name: Karen Bradford MRN: [id number] DOB: [birth year] Today's Date: 10/14/2019 Time:  -     Assessment / Plan / Recommendation Clinical Impression  Patient's cognitive and linguistic abilities are at baseline. All symptoms at onset have resolved. She communicates at the conversational level. No skilled Speech therapy services are needed at this time.            Wynelle Bourgeois., MA, CCC-SLP 10/14/19 12:44

## 2019-10-14 NOTE — ED Notes (Signed)
Pt ambulated to BR with no mobility or gait issues and no complaints of dizziness.

## 2019-10-14 NOTE — Progress Notes (Signed)
Paged MD about change in NIH. New NIH-4. RN will monitor patient closely.

## 2019-10-14 NOTE — Progress Notes (Addendum)
PROGRESS NOTE    [name]  TOI:[id number] DOB: [birth year] DOA: 10/13/2019 PCP: Drake Leach, MD   Brief Narrative:  HPI: Karen Bradford is a 73 y.o. female with medical history significant of hypertension, anxiety, depression, history of breast cancer and CLL presented to [facility] for evaluation of syncope.  Husband reported to ED provider that patient was at the dinner table and at around 8:45 PM had a syncopal episode which lasted a few minutes.  No seizure-like activity noted.    Patient states she was in her usual state of health sitting at the dining table eating dinner with family and friends.  She was not having any chest pain, shortness of breath, lightheadedness/dizziness, or heart palpitations.  After eating dinner all of a sudden she saw flashing lights and could not see her husband anymore.  She does not remember what happened next.  She was told she had passed out twice.  Denies history of stroke or atrial fibrillation.  When she woke up she did not have any focal weakness or numbness.  Denies history of seizures.  ED course: On arrival to the ED, patient noted to have left-sided facial droop, slurred speech, and abnormal finger-to-nose test.  Code stroke was activated.  Telemetry neurology evaluated the patient and felt her NIHSS was 0.  Recommended formal stroke work-up.  Head CT negative for acute infarct.  CTA head and neck negative for LVO.  Brain MRI showing no acute intracranial abnormality.  EKG showing new onset A. fib, rate controlled.  Labs revealed WBC count 22.2.  Rapid SARS-CoV-2 antigen test negative.  Chest x-ray not suggestive of pneumonia.  Corrected sodium 130, potassium 3.4.  Blood glucose 159.  High-sensitivity troponin negative.  Blood ethanol level undetectable.  UA not suggestive of infection.  UDS negative.  Assessment & Plan:   Principal Problem:   Syncope Active Problems:   TIA (transient ischemic attack)   New onset atrial fibrillation (HCC)  Leukocytosis   Hyponatremia  Syncope/TIA: Patient feels better today.  No more symptoms that she came in with.  On exam, she does have a very little left facial droop but no other focal deficit.  CT head and MRI negative for any stroke.  CT angiogram of head and neck unremarkable.  Echo with normal ejection fraction and no PFO.  Grade 2 diastolic dysfunction.  Lipid profile and hemoglobin A1c still pending.  Troponin x2, Negative.  UA unremarkable.  Alcohol level less than 10.  Received aspirin 325 mg this morning.  Per my discussion with Dr. Leonie Man from neurology, she does not need aspirin any longer now that she is on Eliquis.  Will discontinue aspirin. Per nurse earlier, she did have some decreased sensation in lower extremities however she carries a history of neuropathy.  Her NIH score was 4.  To be seen by neurology today.  PT OT and SLP on board.  Passed bedside swallow.  On cardiac diet.  Continue atorvastatin 80 mg.  New onset atrial fibrillation: Rate is controlled however since this was new, cardiology was consulted for possible need of cardioversion.  Seen by cardiology, started on Eliquis and amiodarone for rate control and possible chemical cardioversion.  Will defer further management to them and would like to watch patient at least overnight now that she is on amiodarone and her heart rate is borderline bradycardic.  Leukocytosis: Possibly reactive.  No signs of infection.  Chest x-ray and UA negative.  Repeat CBC tomorrow morning.  Mild hyponatremia: Almost normal at 134.  Mild hypokalemia: Resolved.  Cervical spinal stenosis Brain MRI showing degenerative disc osteophyte at C3-4 with resultant moderate spinal stenosis.  No complaints of neck pain and no motor weakness in upper extremities.  No complaints of sensory deficit of upper extremities. -Outpatient follow-up with neurosurgery  Hypertension Stable.  Resume home dose of losartan.  Monitor closely.  DVT prophylaxis:  Eliquis Code Status: Full code Family Communication:  None present at bedside.  Plan of care discussed with patient in length and he verbalized understanding and agreed with it. Disposition Plan: Potential discharge tomorrow.  Estimated body mass index is 24.21 kg/m as calculated from the following:   Height as of this encounter: '5\' 6"'$  (1.676 m).   Weight as of this encounter: 68 kg.      Nutritional status:               Consultants:   Neurology and cardiology  Procedures:   None  Antimicrobials:   None   Subjective: Seen and examined this morning.  Feels much better.  No complaints.  Objective: Vitals:   10/14/19 0618 10/14/19 0632 10/14/19 0800 10/14/19 1216  BP: 134/68 136/72 136/68 (!) 145/57  Pulse: (!) 56 (!) 56 61 63  Resp: '17 16 16   '$ Temp: 97.8 F (36.6 C) 98.1 F (36.7 C)  98.7 F (37.1 C)  TempSrc: Oral Oral  Oral  SpO2: 97% 100% 99% 98%  Weight:      Height:        Intake/Output Summary (Last 24 hours) at 10/14/2019 1435 Last data filed at 10/14/2019 0411 Gross per 24 hour  Intake 1120 ml  Output 450 ml  Net 670 ml   Filed Weights   10/13/19 2149  Weight: 68 kg    Examination:  General exam: Appears calm and comfortable  Respiratory system: Clear to auscultation. Respiratory effort normal. Cardiovascular system: S1 & S2 heard, irregularly irregular rate and rhythm. No JVD, murmurs, rubs, gallops or clicks. No pedal edema. Gastrointestinal system: Abdomen is nondistended, soft and nontender. No organomegaly or masses felt. Normal bowel sounds heard. Central nervous system: Alert and oriented. No focal neurological deficits. Extremities: Symmetric 5 x 5 power. Skin: No rashes, lesions or ulcers Psychiatry: Judgement and insight appear normal. Mood & affect appropriate.    Data Reviewed: I have personally reviewed following labs and imaging studies  CBC: Recent Labs  Lab 10/13/19 2237 10/14/19 0606  WBC 22.2* 17.1*    NEUTROABS 7.5 3.1  HGB 13.1 12.0  HCT 39.9 35.5*  MCV 90.5 90.8  PLT 411* 250   Basic Metabolic Panel: Recent Labs  Lab 10/13/19 2237 10/14/19 0606  NA 129* 134*  K 3.4* 4.0  CL 95* 99  CO2 23 27  GLUCOSE 159* 112*  BUN 13 11  CREATININE 0.82 0.64  CALCIUM 10.1 9.5  MG  --  1.8   GFR: Estimated Creatinine Clearance: 56.9 mL/min (by C-G formula based on SCr of 0.64 mg/dL). Liver Function Tests: No results for input(s): AST, ALT, ALKPHOS, BILITOT, PROT, ALBUMIN in the last 168 hours. No results for input(s): LIPASE, AMYLASE in the last 168 hours. No results for input(s): AMMONIA in the last 168 hours. Coagulation Profile: Recent Labs  Lab 10/13/19 2237  INR 1.1   Cardiac Enzymes: No results for input(s): CKTOTAL, CKMB, CKMBINDEX, TROPONINI in the last 168 hours. BNP (last 3 results) No results for input(s): PROBNP in the last 8760 hours. HbA1C: No results for input(s): HGBA1C in the last 72 hours. CBG:  Recent Labs  Lab 10/13/19 2218  GLUCAP 137*   Lipid Profile: No results for input(s): CHOL, HDL, LDLCALC, TRIG, CHOLHDL, LDLDIRECT in the last 72 hours. Thyroid Function Tests: No results for input(s): TSH, T4TOTAL, FREET4, T3FREE, THYROIDAB in the last 72 hours. Anemia Panel: No results for input(s): VITAMINB12, FOLATE, FERRITIN, TIBC, IRON, RETICCTPCT in the last 72 hours. Sepsis Labs: No results for input(s): PROCALCITON, LATICACIDVEN in the last 168 hours.  Recent Results (from the past 240 hour(s))  SARS Coronavirus 2 Ag (30 min TAT) - Nasal Swab (BD Veritor Kit)     Status: None   Collection Time: 10/13/19 11:02 PM   Specimen: Nasal Swab (BD Veritor Kit)  Result Value Ref Range Status   SARS Coronavirus 2 Ag NEGATIVE NEGATIVE Final    Comment: (NOTE) SARS-CoV-2 antigen NOT DETECTED.  Negative results are presumptive.  Negative results do not preclude SARS-CoV-2 infection and should not be used as the sole basis for treatment or other patient  management decisions, including infection  control decisions, particularly in the presence of clinical signs and  symptoms consistent with COVID-19, or in those who have been in contact with the virus.  Negative results must be combined with clinical observations, patient history, and epidemiological information. The expected result is Negative. Fact Sheet for Patients: PodPark.tn Fact Sheet for Healthcare Providers: GiftContent.is This test is not yet approved or cleared by the [place] FDA and  has been authorized for detection and/or diagnosis of SARS-CoV-2 by FDA under an Emergency Use Authorization (EUA).  This EUA will remain in effect (meaning this test can be used) for the duration of  the COVID-19 de claration under Section 564(b)(1) of the Act, 21 U.S.C. section 360bbb-3(b)(1), unless the authorization is terminated or revoked sooner. Performed at [facility], [address], [place], [place] [address]       Radiology Studies: CT Angio Head W or Wo Contrast  Result Date: 10/13/2019 CLINICAL DATA:  Initial evaluation for acute dizziness, syncope. EXAM: CT ANGIOGRAPHY HEAD AND NECK TECHNIQUE: Multidetector CT imaging of the head and neck was performed using the standard protocol during bolus administration of intravenous contrast. Multiplanar CT image reconstructions and MIPs were obtained to evaluate the vascular anatomy. Carotid stenosis measurements (when applicable) are obtained utilizing NASCET criteria, using the distal internal carotid diameter as the denominator. CONTRAST:  124m OMNIPAQUE IOHEXOL 350 MG/ML SOLN COMPARISON:  Prior head CT from earlier same day. FINDINGS: CTA NECK FINDINGS Aortic arch: Visualized aortic arch of normal caliber with normal branch pattern. Scattered atheromatous plaque noted about the arch itself. No hemodynamically significant stenosis seen about the origin the great  vessels. Visualized subclavian arteries widely patent. Right carotid system: Right common carotid artery widely patent from its origin to the bifurcation without stenosis. Mild atheromatous irregularity about the right bifurcation without stenosis. Right ICA widely patent distally to the skull base without stenosis, dissection or occlusion. Left carotid system: Left CCA widely patent from its origin to the bifurcation without stenosis. Mild atheromatous irregularity about the left bifurcation without stenosis. Left ICA widely patent distally to the skull base without stenosis, dissection or occlusion. Vertebral arteries: Both vertebral arteries arise from subclavian arteries. Left vertebral artery slightly dominant, with a diffusely hypoplastic right vertebral artery. Focal plaque at the origin left vertebral artery with no more than mild stenosis. Vertebral arteries otherwise widely patent within the neck without stenosis dissection or occlusion. Skeleton: No acute osseous abnormality. No discrete osseous lesions. Moderate cervical spondylosis  noted at C3-4 through C6-7. Other neck: No other acute soft tissue abnormality within the neck. No mass lesion or adenopathy. Few scattered subcentimeter hypodense thyroid nodules noted, for which no follow-up imaging recommended given size and patient age. Upper chest: Visualized upper chest demonstrates no acute finding. Review of the MIP images confirms the above findings CTA HEAD FINDINGS Anterior circulation: Petrous segments widely patent bilaterally. Mild atheromatous change within the carotid siphons without significant stenosis. A1 segments patent bilaterally. Normal anterior communicating artery complex. Anterior cerebral arteries widely patent or distal aspects without stenosis. No M1 stenosis or occlusion. Normal MCA bifurcations. Distal MCA branches well perfused and symmetric. Posterior circulation: Vertebral arteries widely patent to the vertebrobasilar  junction without stenosis. Posterior inferior cerebral arteries patent bilaterally. Basilar widely patent to its distal aspect. Superior cerebral arteries patent bilaterally. Left PCA primarily supplied via the basilar. Fetal type origin of the right PCA. Both PCAs widely patent to their distal aspects. Venous sinuses: Patent. Anatomic variants: Fetal type origin right PCA. No intracranial aneurysm. Review of the MIP images confirms the above findings IMPRESSION: 1. Negative CTA for emergent large vessel occlusion. 2. Mild atheromatous change about the aortic arch, carotid bifurcations, and carotid siphons without hemodynamically significant or correctable stenosis. Electronically Signed   By: Jeannine Boga M.D.   On: 10/13/2019 23:22   CT Angio Neck W and/or Wo Contrast  Result Date: 10/13/2019 CLINICAL DATA:  Initial evaluation for acute dizziness, syncope. EXAM: CT ANGIOGRAPHY HEAD AND NECK TECHNIQUE: Multidetector CT imaging of the head and neck was performed using the standard protocol during bolus administration of intravenous contrast. Multiplanar CT image reconstructions and MIPs were obtained to evaluate the vascular anatomy. Carotid stenosis measurements (when applicable) are obtained utilizing NASCET criteria, using the distal internal carotid diameter as the denominator. CONTRAST:  1106m OMNIPAQUE IOHEXOL 350 MG/ML SOLN COMPARISON:  Prior head CT from earlier same day. FINDINGS: CTA NECK FINDINGS Aortic arch: Visualized aortic arch of normal caliber with normal branch pattern. Scattered atheromatous plaque noted about the arch itself. No hemodynamically significant stenosis seen about the origin the great vessels. Visualized subclavian arteries widely patent. Right carotid system: Right common carotid artery widely patent from its origin to the bifurcation without stenosis. Mild atheromatous irregularity about the right bifurcation without stenosis. Right ICA widely patent distally to the  skull base without stenosis, dissection or occlusion. Left carotid system: Left CCA widely patent from its origin to the bifurcation without stenosis. Mild atheromatous irregularity about the left bifurcation without stenosis. Left ICA widely patent distally to the skull base without stenosis, dissection or occlusion. Vertebral arteries: Both vertebral arteries arise from subclavian arteries. Left vertebral artery slightly dominant, with a diffusely hypoplastic right vertebral artery. Focal plaque at the origin left vertebral artery with no more than mild stenosis. Vertebral arteries otherwise widely patent within the neck without stenosis dissection or occlusion. Skeleton: No acute osseous abnormality. No discrete osseous lesions. Moderate cervical spondylosis noted at C3-4 through C6-7. Other neck: No other acute soft tissue abnormality within the neck. No mass lesion or adenopathy. Few scattered subcentimeter hypodense thyroid nodules noted, for which no follow-up imaging recommended given size and patient age. Upper chest: Visualized upper chest demonstrates no acute finding. Review of the MIP images confirms the above findings CTA HEAD FINDINGS Anterior circulation: Petrous segments widely patent bilaterally. Mild atheromatous change within the carotid siphons without significant stenosis. A1 segments patent bilaterally. Normal anterior communicating artery complex. Anterior cerebral arteries widely patent or distal aspects without  stenosis. No M1 stenosis or occlusion. Normal MCA bifurcations. Distal MCA branches well perfused and symmetric. Posterior circulation: Vertebral arteries widely patent to the vertebrobasilar junction without stenosis. Posterior inferior cerebral arteries patent bilaterally. Basilar widely patent to its distal aspect. Superior cerebral arteries patent bilaterally. Left PCA primarily supplied via the basilar. Fetal type origin of the right PCA. Both PCAs widely patent to their distal  aspects. Venous sinuses: Patent. Anatomic variants: Fetal type origin right PCA. No intracranial aneurysm. Review of the MIP images confirms the above findings IMPRESSION: 1. Negative CTA for emergent large vessel occlusion. 2. Mild atheromatous change about the aortic arch, carotid bifurcations, and carotid siphons without hemodynamically significant or correctable stenosis. Electronically Signed   By: Jeannine Boga M.D.   On: 10/13/2019 23:22   MR BRAIN WO CONTRAST  Result Date: 10/14/2019 CLINICAL DATA:  Initial evaluation for transient ischemic attack, sudden onset dizziness. EXAM: MRI HEAD WITHOUT CONTRAST TECHNIQUE: Multiplanar, multiecho pulse sequences of the brain and surrounding structures were obtained without intravenous contrast. COMPARISON:  Prior CT and CTA from 10/13/2019. FINDINGS: Brain: Mild age-related cerebral atrophy. Minimal patchy T2/FLAIR hyperintensity within the periventricular and deep white matter, most consistent with chronic small vessel ischemic disease, minimal for age. No abnormal foci of restricted diffusion to suggest acute or subacute ischemia. Gray-white matter differentiation maintained. No encephalomalacia to suggest chronic cortical infarction. No foci of susceptibility artifact to suggest acute or chronic intracranial hemorrhage. No mass lesion, midline shift or mass effect. No hydrocephalus. No extra-axial fluid collection. Pituitary gland suprasellar region normal. Midline structures intact. Vascular: Major intracranial vascular flow voids are maintained. Skull and upper cervical spine: Craniocervical junction within normal limits. Degenerative disc osteophyte noted at C3-4 with resultant moderate spinal stenosis. Bone marrow signal intensity within normal limits. No scalp soft tissue abnormality. Sinuses/Orbits: Patient status post bilateral ocular lens replacement. Globes and orbital soft tissues demonstrate no acute finding. Paranasal sinuses are clear.  Trace bilateral mastoid effusions, of doubtful significance. Other: None. IMPRESSION: 1. No acute intracranial abnormality. 2. Age-related cerebral atrophy with mild chronic small vessel ischemic disease. 3. Degenerative disc osteophyte at C3-4 with resultant moderate spinal stenosis. Finding could be further assessed with dedicated MRI of the cervical spine as clinically desired. Electronically Signed   By: Jeannine Boga M.D.   On: 10/14/2019 03:54   DG Chest Portable 1 View  Result Date: 10/13/2019 CLINICAL DATA:  Infectious workup. Sudden onset of dizziness leading to syncope. EXAM: PORTABLE CHEST 1 VIEW COMPARISON:  Lung apices from neck CTA earlier this day. FINDINGS: Heart is normal in size. Aortic tortuosity, otherwise normal mediastinal contours. Aortic atherosclerosis. Pulmonary vasculature is normal. No consolidation, pleural effusion, or pneumothorax. No acute osseous abnormalities are seen. IMPRESSION: 1. No acute abnormality.  No evidence of intrathoracic infection. 2.  Aortic Atherosclerosis (ICD10-I70.0). Electronically Signed   By: Keith Rake M.D.   On: 10/13/2019 23:36   ECHOCARDIOGRAM COMPLETE  Result Date: 10/14/2019   ECHOCARDIOGRAM REPORT   Patient Name:   JOUA BAKE Date of Exam: 10/14/2019 Medical Rec #:  [id number]   Height:       66.0 in Accession #:    [id number]  Weight:       150.0 lb Date of Birth:  Feb 25, 1946   BSA:          1.77 m Patient Age:    58 years    BP:           136/72 mmHg Patient Gender: F  HR:           56 bpm. Exam Location:  Inpatient Procedure: 2D Echo, Cardiac Doppler and Color Doppler Indications:    I48.91* Unspeicified atrial fibrillation. TIA.  History:        Patient has no prior history of Echocardiogram examinations.                 TIA, Arrythmias:Atrial Fibrillation; Signs/Symptoms:Syncope.  Sonographer:    Roseanna Rainbow RDCS Referring Phys: [id number] [place]  1. Left ventricular ejection fraction, by visual  estimation, is 60 to 65%. The left ventricle has normal function. Left ventricular septal wall thickness was mildly increased. There is mildly increased left ventricular hypertrophy.  2. Left ventricular diastolic parameters are consistent with Grade II diastolic dysfunction (pseudonormalization).  3. The left ventricle has no regional wall motion abnormalities.  4. Global right ventricle has normal systolic function.The right ventricular size is normal. No increase in right ventricular wall thickness.  5. Left atrial size was normal.  6. Right atrial size was normal.  7. The mitral valve is normal in structure. Trivial mitral valve regurgitation.  8. The tricuspid valve is normal in structure. Tricuspid valve regurgitation is trivial.  9. The aortic valve is normal in structure. Aortic valve regurgitation is not visualized. 10. The pulmonic valve was grossly normal. Pulmonic valve regurgitation is not visualized. 11. Moderately elevated pulmonary artery systolic pressure. 12. The inferior vena cava is normal in size with greater than 50% respiratory variability, suggesting right atrial pressure of 3 mmHg. 13. The interatrial septum was not assessed. FINDINGS  Left Ventricle: Left ventricular ejection fraction, by visual estimation, is 60 to 65%. The left ventricle has normal function. The left ventricle has no regional wall motion abnormalities. There is mildly increased left ventricular hypertrophy. Left ventricular diastolic parameters are consistent with Grade II diastolic dysfunction (pseudonormalization). Right Ventricle: The right ventricular size is normal. No increase in right ventricular wall thickness. Global RV systolic function is has normal systolic function. The tricuspid regurgitant velocity is 2.96 m/s, and with an assumed right atrial pressure  of 8 mmHg, the estimated right ventricular systolic pressure is moderately elevated at 43.0 mmHg. Left Atrium: Left atrial size was normal in size. Right  Atrium: Right atrial size was normal in size Pericardium: There is no evidence of pericardial effusion. Mitral Valve: The mitral valve is normal in structure. Trivial mitral valve regurgitation. MV peak gradient, 6.2 mmHg. Tricuspid Valve: The tricuspid valve is normal in structure. Tricuspid valve regurgitation is trivial. Aortic Valve: The aortic valve is normal in structure. Aortic valve regurgitation is not visualized. Pulmonic Valve: The pulmonic valve was grossly normal. Pulmonic valve regurgitation is not visualized. Pulmonic regurgitation is not visualized. Aorta: The aortic root is normal in size and structure. Venous: The inferior vena cava is normal in size with greater than 50% respiratory variability, suggesting right atrial pressure of 3 mmHg. IAS/Shunts: The interatrial septum was not assessed.  LEFT VENTRICLE PLAX 2D LVIDd:         3.60 cm       Diastology LVIDs:         2.30 cm       LV e' lateral:   5.08 cm/s LV PW:         1.50 cm       LV E/e' lateral: 25.6 LV IVS:        1.30 cm       LV e' medial:    6.18 cm/s  LVOT diam:     1.90 cm       LV E/e' medial:  21.0 LV SV:         36 ml LV SV Index:   20.31 LVOT Area:     2.84 cm  LV Volumes (MOD) LV area d, A2C:    20.70 cm LV area d, A4C:    22.00 cm LV area s, A2C:    12.00 cm LV area s, A4C:    12.30 cm LV major d, A2C:   6.62 cm LV major d, A4C:   6.78 cm LV major s, A2C:   5.61 cm LV major s, A4C:   5.76 cm LV vol d, MOD A2C: 53.1 ml LV vol d, MOD A4C: 58.6 ml LV vol s, MOD A2C: 21.2 ml LV vol s, MOD A4C: 22.6 ml LV SV MOD A2C:     31.9 ml LV SV MOD A4C:     58.6 ml LV SV MOD BP:      34.2 ml RIGHT VENTRICLE            IVC RV S prime:     9.59 cm/s  IVC diam: 2.50 cm TAPSE (M-mode): 1.8 cm LEFT ATRIUM             Index       RIGHT ATRIUM           Index LA diam:        3.40 cm 1.92 cm/m  RA Area:     15.10 cm LA Vol (A2C):   38.4 ml 21.70 ml/m RA Volume:   34.20 ml  19.33 ml/m LA Vol (A4C):   43.8 ml 24.75 ml/m LA Biplane Vol: 43.6 ml  24.64 ml/m  AORTIC VALVE LVOT Vmax:   73.80 cm/s LVOT Vmean:  46.000 cm/s LVOT VTI:    0.140 m  AORTA Ao Root diam: 2.80 cm Ao Asc diam:  3.30 cm MITRAL VALVE                         TRICUSPID VALVE MV Area (PHT): 3.21 cm              TR Peak grad:   35.0 mmHg MV Peak grad:  6.2 mmHg              TR Vmax:        296.00 cm/s MV Mean grad:  2.0 mmHg MV Vmax:       1.25 m/s              SHUNTS MV Vmean:      68.8 cm/s             Systemic VTI:  0.14 m MV VTI:        0.42 m                Systemic Diam: 1.90 cm MV PHT:        68.44 msec MV Decel Time: 236 msec MV E velocity: 130.00 cm/s 103 cm/s MV A velocity: 40.40 cm/s  70.3 cm/s MV E/A ratio:  3.22        1.5  Charolette Forward MD Electronically signed by Charolette Forward MD Signature Date/Time: 10/14/2019/10:42:41 AM    Final    CT HEAD CODE STROKE WO CONTRAST  Result Date: 10/13/2019 CLINICAL DATA:  Code stroke. Initial evaluation for acute dizziness, syncope. EXAM: CT HEAD WITHOUT CONTRAST TECHNIQUE: Contiguous axial images were obtained from  the base of the skull through the vertex without intravenous contrast. COMPARISON:  Prior head CT from 09/10/2005. FINDINGS: Brain: Generalized age-related cerebral atrophy with mild chronic small vessel ischemic disease. No acute intracranial hemorrhage. No acute large vessel territory infarct. No mass lesion, midline shift or mass effect. No hydrocephalus. No extra-axial fluid collection. Small dilated perivascular space noted at the inferior right basal ganglia. Vascular: No hyperdense vessel. Skull: Scalp soft tissues and calvarium within normal limits. Sinuses/Orbits: Globes orbital soft tissues normal. Paranasal sinuses mastoid air cells are clear. Other: None. ASPECTS [facility] Stroke Program Early CT Score) - Ganglionic level infarction (caudate, lentiform nuclei, internal capsule, insula, M1-M3 cortex): 7 - Supraganglionic infarction (M4-M6 cortex): 3 Total score (0-10 with 10 being normal): 10 IMPRESSION: 1. No acute  intracranial infarct or other abnormality identified. 2. ASPECTS is 10. 3. Age-related cerebral atrophy with mild chronic small vessel ischemic disease. Critical Value/emergent results were called by telephone at the time of interpretation on 10/13/2019 at 10:42 pm to providerMATTHEW TRIFAN , who verbally acknowledged these results. Electronically Signed   By: Jeannine Boga M.D.   On: 10/13/2019 22:43    Scheduled Meds: . amiodarone  100 mg Oral Daily  . apixaban  5 mg Oral BID  . aspirin  325 mg Oral Daily  . atorvastatin  80 mg Oral q1800   Continuous Infusions:   LOS: 0 days   Time spent: 34 minutes   Darliss Cheney, MD [facility]  10/14/2019, 2:35 PM   To contact the attending provider between 7A-7P or the covering provider during after hours 7P-7A, please log into the web site [email or web address] and use password [email or web address].

## 2019-10-14 NOTE — H&P (Signed)
History and Physical    Karen Bradford [id number] DOB: Jan 17, 1946 DOA: 10/13/2019  PCP: Drake Leach, MD Patient coming from: [facility]  Chief Complaint: Syncope  HPI: Karen Bradford is a 73 y.o. female with medical history significant of hypertension, anxiety, depression, history of breast cancer and CLL presented to [facility] for evaluation of syncope.  Husband reported to ED provider that patient was at the dinner table and at around 8:45 PM had a syncopal episode which lasted a few minutes.  No seizure-like activity noted.    Patient states she was in her usual state of health sitting at the dining table eating dinner with family and friends.  She was not having any chest pain, shortness of breath, lightheadedness/dizziness, or heart palpitations.  After eating dinner all of a sudden she saw flashing lights and could not see her husband anymore.  She does not remember what happened next.  She was told she had passed out twice.  Denies history of stroke or atrial fibrillation.  When she woke up she did not have any focal weakness or numbness.  Denies history of seizures.  ED course: On arrival to the ED, patient noted to have left-sided facial droop, slurred speech, and abnormal finger-to-nose test.  Code stroke was activated.  Telemetry neurology evaluated the patient and felt her NIHSS was 0.  Recommended formal stroke work-up.  Head CT negative for acute infarct.  CTA head and neck negative for LVO.  Brain MRI showing no acute intracranial abnormality.  EKG showing new onset A. fib, rate controlled.  Labs revealed WBC count 22.2.  Rapid SARS-CoV-2 antigen test negative.  Chest x-ray not suggestive of pneumonia.  Corrected sodium 130, potassium 3.4.  Blood glucose 159.  High-sensitivity troponin negative.  Blood ethanol level undetectable.  UA not suggestive of infection.  UDS negative.  Review of Systems:  All systems reviewed and apart from history of presenting illness, are negative.  Past Medical  History:  Diagnosis Date  . Anxiety   . Cancer ([place])    breast  . Chronic lymphocytic leukemia ([place])   . Depression   . GERD (gastroesophageal reflux disease)   . Glaucoma   . Hypertension   . Lymphocytosis   . Polyneuropathic pain     Past Surgical History:  Procedure Laterality Date  . ARTHRODESIS METATARSALPHALANGEAL JOINT (MTPJ) Right 08/11/2017   Procedure: Right Hallux Metatarsal Phalangeal Joint Arthrodesis; Excision of Bunionette;  Surgeon: Wylene Simmer, MD;  Location: [facility];  Service: Orthopedics;  Laterality: Right;  . BREAST SURGERY     mastectomy  . GLAUCOMA SURGERY       reports that she has never smoked. She has never used smokeless tobacco. She reports current alcohol use of about 2.0 standard drinks of alcohol per week. She reports that she does not use drugs.  Allergies  Allergen Reactions  . Colesevelam Rash    Family History  Problem Relation Age of Onset  . Stroke Mother   . Brain cancer Father     Prior to Admission medications   Medication Sig Start Date End Date Taking? Authorizing Provider  ARIPiprazole (ABILIFY) 5 MG tablet Take 2 mg by mouth daily.     [provider]  cholecalciferol (VITAMIN D) 1000 units tablet Take 1,000 Units by mouth daily.    [provider]  clonazePAM (KLONOPIN) 1 MG tablet Take 1 mg by mouth 2 (two) times daily.    [provider]  dorzolamide-timolol (COSOPT) 22.3-6.8 MG/ML ophthalmic solution 1  drop 2 (two) times daily.    [provider]  losartan (COZAAR) 100 MG tablet Take 100 mg by mouth daily.    [provider]  magnesium oxide (MAG-OX) 400 MG tablet Take 400 mg by mouth daily.    [provider]  omeprazole (PRILOSEC) 40 MG capsule Take 40 mg by mouth daily.    [provider]  oxybutynin (DITROPAN) 5 MG tablet Take 5 mg by mouth 3 (three) times daily.    [provider]  QUEtiapine (SEROQUEL) 100 MG tablet Take 25 mg  by mouth at bedtime.     [provider]  sertraline (ZOLOFT) 100 MG tablet Take 200 mg by mouth daily.     [provider]  timolol (TIMOPTIC) 0.5 % ophthalmic solution 1 drop 2 (two) times daily.    [provider]  zolpidem (AMBIEN) 5 MG tablet Take 5 mg by mouth at bedtime as needed for sleep.    [provider]    Physical Exam: Vitals:   10/14/19 0031 10/14/19 0040 10/14/19 0142 10/14/19 0402  BP:  134/71 127/63 116/66  Pulse: 74 72 67 (!) 59  Resp: 15 13 16 16   Temp:  97.6 F (36.4 C) 97.6 F (36.4 C) 97.7 F (36.5 C)  TempSrc:  Oral Oral Oral  SpO2: 98% 98% 98% 99%  Weight:      Height:        Physical Exam  Constitutional: She is oriented to person, place, and time. She appears well-developed and well-nourished. No distress.  HENT:  Head: Normocephalic.  Eyes: EOM are normal. Right eye exhibits no discharge. Left eye exhibits no discharge.  Cardiovascular: Normal rate and intact distal pulses.  Irregularly irregular rhythm  Pulmonary/Chest: Effort normal and breath sounds normal. No respiratory distress. She has no wheezes. She has no rales.  Abdominal: Soft. Bowel sounds are normal. She exhibits no distension. There is no abdominal tenderness. There is no guarding.  Musculoskeletal:        General: No edema.     Cervical back: Neck supple.  Neurological: She is alert and oriented to person, place, and time.  Speech fluent, tongue midline Very mild left facial droop No focal motor or sensory deficit Finger-to-nose test normal  Skin: Skin is warm and dry. She is not diaphoretic.     Labs on Admission: I have personally reviewed following labs and imaging studies  CBC: Recent Labs  Lab 10/13/19 2237  WBC 22.2*  NEUTROABS 7.5  HGB 13.1  HCT 39.9  MCV 90.5  PLT 123456*   Basic Metabolic Panel: Recent Labs  Lab 10/13/19 2237  NA 129*  K 3.4*  CL 95*  CO2 23  GLUCOSE 159*  BUN 13  CREATININE 0.82  CALCIUM 10.1    GFR: Estimated Creatinine Clearance: 55.5 mL/min (by C-G formula based on SCr of 0.82 mg/dL). Liver Function Tests: No results for input(s): AST, ALT, ALKPHOS, BILITOT, PROT, ALBUMIN in the last 168 hours. No results for input(s): LIPASE, AMYLASE in the last 168 hours. No results for input(s): AMMONIA in the last 168 hours. Coagulation Profile: Recent Labs  Lab 10/13/19 2237  INR 1.1   Cardiac Enzymes: No results for input(s): CKTOTAL, CKMB, CKMBINDEX, TROPONINI in the last 168 hours. BNP (last 3 results) No results for input(s): PROBNP in the last 8760 hours. HbA1C: No results for input(s): HGBA1C in the last 72 hours. CBG: Recent Labs  Lab 10/13/19 2218  GLUCAP 137*   Lipid Profile: No  results for input(s): CHOL, HDL, LDLCALC, TRIG, CHOLHDL, LDLDIRECT in the last 72 hours. Thyroid Function Tests: No results for input(s): TSH, T4TOTAL, FREET4, T3FREE, THYROIDAB in the last 72 hours. Anemia Panel: No results for input(s): VITAMINB12, FOLATE, FERRITIN, TIBC, IRON, RETICCTPCT in the last 72 hours. Urine analysis:    Component Value Date/Time   COLORURINE STRAW (A) 10/13/2019 2301   APPEARANCEUR CLEAR 10/13/2019 2301   LABSPEC <1.005 (L) 10/13/2019 2301   PHURINE 7.0 10/13/2019 2301   GLUCOSEU NEGATIVE 10/13/2019 2301   HGBUR TRACE (A) 10/13/2019 2301   BILIRUBINUR NEGATIVE 10/13/2019 2301   KETONESUR NEGATIVE 10/13/2019 2301   PROTEINUR 100 (A) 10/13/2019 2301   NITRITE NEGATIVE 10/13/2019 2301   LEUKOCYTESUR NEGATIVE 10/13/2019 2301    Radiological Exams on Admission: CT Angio Head W or Wo Contrast  Result Date: 10/13/2019 CLINICAL DATA:  Initial evaluation for acute dizziness, syncope. EXAM: CT ANGIOGRAPHY HEAD AND NECK TECHNIQUE: Multidetector CT imaging of the head and neck was performed using the standard protocol during bolus administration of intravenous contrast. Multiplanar CT image reconstructions and MIPs were obtained to evaluate the vascular anatomy.  Carotid stenosis measurements (when applicable) are obtained utilizing NASCET criteria, using the distal internal carotid diameter as the denominator. CONTRAST:  141mL OMNIPAQUE IOHEXOL 350 MG/ML SOLN COMPARISON:  Prior head CT from earlier same day. FINDINGS: CTA NECK FINDINGS Aortic arch: Visualized aortic arch of normal caliber with normal branch pattern. Scattered atheromatous plaque noted about the arch itself. No hemodynamically significant stenosis seen about the origin the great vessels. Visualized subclavian arteries widely patent. Right carotid system: Right common carotid artery widely patent from its origin to the bifurcation without stenosis. Mild atheromatous irregularity about the right bifurcation without stenosis. Right ICA widely patent distally to the skull base without stenosis, dissection or occlusion. Left carotid system: Left CCA widely patent from its origin to the bifurcation without stenosis. Mild atheromatous irregularity about the left bifurcation without stenosis. Left ICA widely patent distally to the skull base without stenosis, dissection or occlusion. Vertebral arteries: Both vertebral arteries arise from subclavian arteries. Left vertebral artery slightly dominant, with a diffusely hypoplastic right vertebral artery. Focal plaque at the origin left vertebral artery with no more than mild stenosis. Vertebral arteries otherwise widely patent within the neck without stenosis dissection or occlusion. Skeleton: No acute osseous abnormality. No discrete osseous lesions. Moderate cervical spondylosis noted at C3-4 through C6-7. Other neck: No other acute soft tissue abnormality within the neck. No mass lesion or adenopathy. Few scattered subcentimeter hypodense thyroid nodules noted, for which no follow-up imaging recommended given size and patient age. Upper chest: Visualized upper chest demonstrates no acute finding. Review of the MIP images confirms the above findings CTA HEAD FINDINGS  Anterior circulation: Petrous segments widely patent bilaterally. Mild atheromatous change within the carotid siphons without significant stenosis. A1 segments patent bilaterally. Normal anterior communicating artery complex. Anterior cerebral arteries widely patent or distal aspects without stenosis. No M1 stenosis or occlusion. Normal MCA bifurcations. Distal MCA branches well perfused and symmetric. Posterior circulation: Vertebral arteries widely patent to the vertebrobasilar junction without stenosis. Posterior inferior cerebral arteries patent bilaterally. Basilar widely patent to its distal aspect. Superior cerebral arteries patent bilaterally. Left PCA primarily supplied via the basilar. Fetal type origin of the right PCA. Both PCAs widely patent to their distal aspects. Venous sinuses: Patent. Anatomic variants: Fetal type origin right PCA. No intracranial aneurysm. Review of the MIP images confirms the above findings IMPRESSION: 1. Negative CTA for emergent  large vessel occlusion. 2. Mild atheromatous change about the aortic arch, carotid bifurcations, and carotid siphons without hemodynamically significant or correctable stenosis. Electronically Signed   By: Jeannine Boga M.D.   On: 10/13/2019 23:22   CT Angio Neck W and/or Wo Contrast  Result Date: 10/13/2019 CLINICAL DATA:  Initial evaluation for acute dizziness, syncope. EXAM: CT ANGIOGRAPHY HEAD AND NECK TECHNIQUE: Multidetector CT imaging of the head and neck was performed using the standard protocol during bolus administration of intravenous contrast. Multiplanar CT image reconstructions and MIPs were obtained to evaluate the vascular anatomy. Carotid stenosis measurements (when applicable) are obtained utilizing NASCET criteria, using the distal internal carotid diameter as the denominator. CONTRAST:  183mL OMNIPAQUE IOHEXOL 350 MG/ML SOLN COMPARISON:  Prior head CT from earlier same day. FINDINGS: CTA NECK FINDINGS Aortic arch:  Visualized aortic arch of normal caliber with normal branch pattern. Scattered atheromatous plaque noted about the arch itself. No hemodynamically significant stenosis seen about the origin the great vessels. Visualized subclavian arteries widely patent. Right carotid system: Right common carotid artery widely patent from its origin to the bifurcation without stenosis. Mild atheromatous irregularity about the right bifurcation without stenosis. Right ICA widely patent distally to the skull base without stenosis, dissection or occlusion. Left carotid system: Left CCA widely patent from its origin to the bifurcation without stenosis. Mild atheromatous irregularity about the left bifurcation without stenosis. Left ICA widely patent distally to the skull base without stenosis, dissection or occlusion. Vertebral arteries: Both vertebral arteries arise from subclavian arteries. Left vertebral artery slightly dominant, with a diffusely hypoplastic right vertebral artery. Focal plaque at the origin left vertebral artery with no more than mild stenosis. Vertebral arteries otherwise widely patent within the neck without stenosis dissection or occlusion. Skeleton: No acute osseous abnormality. No discrete osseous lesions. Moderate cervical spondylosis noted at C3-4 through C6-7. Other neck: No other acute soft tissue abnormality within the neck. No mass lesion or adenopathy. Few scattered subcentimeter hypodense thyroid nodules noted, for which no follow-up imaging recommended given size and patient age. Upper chest: Visualized upper chest demonstrates no acute finding. Review of the MIP images confirms the above findings CTA HEAD FINDINGS Anterior circulation: Petrous segments widely patent bilaterally. Mild atheromatous change within the carotid siphons without significant stenosis. A1 segments patent bilaterally. Normal anterior communicating artery complex. Anterior cerebral arteries widely patent or distal aspects without  stenosis. No M1 stenosis or occlusion. Normal MCA bifurcations. Distal MCA branches well perfused and symmetric. Posterior circulation: Vertebral arteries widely patent to the vertebrobasilar junction without stenosis. Posterior inferior cerebral arteries patent bilaterally. Basilar widely patent to its distal aspect. Superior cerebral arteries patent bilaterally. Left PCA primarily supplied via the basilar. Fetal type origin of the right PCA. Both PCAs widely patent to their distal aspects. Venous sinuses: Patent. Anatomic variants: Fetal type origin right PCA. No intracranial aneurysm. Review of the MIP images confirms the above findings IMPRESSION: 1. Negative CTA for emergent large vessel occlusion. 2. Mild atheromatous change about the aortic arch, carotid bifurcations, and carotid siphons without hemodynamically significant or correctable stenosis. Electronically Signed   By: Jeannine Boga M.D.   On: 10/13/2019 23:22   MR BRAIN WO CONTRAST  Result Date: 10/14/2019 CLINICAL DATA:  Initial evaluation for transient ischemic attack, sudden onset dizziness. EXAM: MRI HEAD WITHOUT CONTRAST TECHNIQUE: Multiplanar, multiecho pulse sequences of the brain and surrounding structures were obtained without intravenous contrast. COMPARISON:  Prior CT and CTA from 10/13/2019. FINDINGS: Brain: Mild age-related cerebral atrophy. Minimal patchy  T2/FLAIR hyperintensity within the periventricular and deep white matter, most consistent with chronic small vessel ischemic disease, minimal for age. No abnormal foci of restricted diffusion to suggest acute or subacute ischemia. Gray-white matter differentiation maintained. No encephalomalacia to suggest chronic cortical infarction. No foci of susceptibility artifact to suggest acute or chronic intracranial hemorrhage. No mass lesion, midline shift or mass effect. No hydrocephalus. No extra-axial fluid collection. Pituitary gland suprasellar region normal. Midline  structures intact. Vascular: Major intracranial vascular flow voids are maintained. Skull and upper cervical spine: Craniocervical junction within normal limits. Degenerative disc osteophyte noted at C3-4 with resultant moderate spinal stenosis. Bone marrow signal intensity within normal limits. No scalp soft tissue abnormality. Sinuses/Orbits: Patient status post bilateral ocular lens replacement. Globes and orbital soft tissues demonstrate no acute finding. Paranasal sinuses are clear. Trace bilateral mastoid effusions, of doubtful significance. Other: None. IMPRESSION: 1. No acute intracranial abnormality. 2. Age-related cerebral atrophy with mild chronic small vessel ischemic disease. 3. Degenerative disc osteophyte at C3-4 with resultant moderate spinal stenosis. Finding could be further assessed with dedicated MRI of the cervical spine as clinically desired. Electronically Signed   By: Jeannine Boga M.D.   On: 10/14/2019 03:54   DG Chest Portable 1 View  Result Date: 10/13/2019 CLINICAL DATA:  Infectious workup. Sudden onset of dizziness leading to syncope. EXAM: PORTABLE CHEST 1 VIEW COMPARISON:  Lung apices from neck CTA earlier this day. FINDINGS: Heart is normal in size. Aortic tortuosity, otherwise normal mediastinal contours. Aortic atherosclerosis. Pulmonary vasculature is normal. No consolidation, pleural effusion, or pneumothorax. No acute osseous abnormalities are seen. IMPRESSION: 1. No acute abnormality.  No evidence of intrathoracic infection. 2.  Aortic Atherosclerosis (ICD10-I70.0). Electronically Signed   By: Keith Rake M.D.   On: 10/13/2019 23:36   CT HEAD CODE STROKE WO CONTRAST  Result Date: 10/13/2019 CLINICAL DATA:  Code stroke. Initial evaluation for acute dizziness, syncope. EXAM: CT HEAD WITHOUT CONTRAST TECHNIQUE: Contiguous axial images were obtained from the base of the skull through the vertex without intravenous contrast. COMPARISON:  Prior head CT from  09/10/2005. FINDINGS: Brain: Generalized age-related cerebral atrophy with mild chronic small vessel ischemic disease. No acute intracranial hemorrhage. No acute large vessel territory infarct. No mass lesion, midline shift or mass effect. No hydrocephalus. No extra-axial fluid collection. Small dilated perivascular space noted at the inferior right basal ganglia. Vascular: No hyperdense vessel. Skull: Scalp soft tissues and calvarium within normal limits. Sinuses/Orbits: Globes orbital soft tissues normal. Paranasal sinuses mastoid air cells are clear. Other: None. ASPECTS [facility] Stroke Program Early CT Score) - Ganglionic level infarction (caudate, lentiform nuclei, internal capsule, insula, M1-M3 cortex): 7 - Supraganglionic infarction (M4-M6 cortex): 3 Total score (0-10 with 10 being normal): 10 IMPRESSION: 1. No acute intracranial infarct or other abnormality identified. 2. ASPECTS is 10. 3. Age-related cerebral atrophy with mild chronic small vessel ischemic disease. Critical Value/emergent results were called by telephone at the time of interpretation on 10/13/2019 at 10:42 pm to providerMATTHEW TRIFAN , who verbally acknowledged these results. Electronically Signed   By: Jeannine Boga M.D.   On: 10/13/2019 22:43    EKG: Independently reviewed. A. fib, heart rate 92.  LVH with secondary repolarization abnormality.  These EKG changes are new compared to prior tracing from October 2018.  Assessment/Plan Principal Problem:   Syncope Active Problems:   TIA (transient ischemic attack)   New onset atrial fibrillation (HCC)   Leukocytosis   Hyponatremia   Syncope/concern for TIA Patient had a syncopal  episode while at the dinner table last night at 8:45 PM.  Reports flashing lights in her vision.  No dyspnea, heart palpitations, chest pain, or lightheadedness/dizziness.  She lost consciousness for a few minutes and family did not notice any seizure-like activity.  Does have risk factors for  stroke and EKG showing new onset A. fib.  However, head CT and brain MRI negative for acute infarct.  CTA head and neck negative for LVO.  Evaluated by telemetry neurology.  Admitted for TIA work-up. -Telemetry monitoring -2D echocardiogram -Hemoglobin A1c, fasting lipid panel -Aspirin 325 daily -Atorvastatin 80 mg daily -Frequent neurochecks -PT, OT, speech therapy. -N.p.o. until cleared by bedside swallow evaluation or formal speech evaluation -Check orthostatics -I have consulted neurology here, stroke team will see the patient in the morning.  New onset A. Fib Currently rate controlled.  Unclear how long patient has been in A. fib.  Last EKG in the chart from October 2018 showing sinus rhythm. -CHA2DS2VASc 6.  Consult cardiology in a.m. to discuss need for anticoagulation.  Echocardiogram pending.  Leukocytosis Possibly reactive.  WBC count 22.2.  Patient is afebrile and nontoxic-appearing.  Chest x-ray not suggestive of pneumonia.  UA not suggestive of infection. -Repeat CBC with differential in a.m.  Mild hyponatremia Sodium 130.  Not on a thiazide diuretic.  Not endorsing vomiting or diarrhea. -Received 1 L fluid bolus in the ED -Continue to monitor sodium level -Check serum osmolarity  Mild hypokalemia Potassium 3.4. -Replete potassium.  Check magnesium level and replete if low.  Continue to monitor electrolytes.  Cervical spinal stenosis Brain MRI showing degenerative disc osteophyte at C3-4 with resultant moderate spinal stenosis.  No complaints of neck pain and no motor weakness in upper extremities.  No complaints of sensory deficit of upper extremities. -Outpatient follow-up with neurosurgery  Hypertension -Stable.  Currently normotensive.  Pharmacy med rec pending.  DVT prophylaxis: Lovenox Code Status: Full code.  Discussed with the patient. Family Communication: No family available at this time. Disposition Plan: Anticipate discharge after clinical  improvement. Consults called: Neurology (Dr. Rory Percy) Admission status: It is my clinical opinion that referral for OBSERVATION is reasonable and necessary in this patient based on the above information provided. The aforementioned taken together are felt to place the patient at high risk for further clinical deterioration. However it is anticipated that the patient may be medically stable for discharge from the hospital within 24 to 48 hours.  The medical decision making on this patient was of high complexity and the patient is at high risk for clinical deterioration, therefore this is a level 3 visit.  Shela Leff MD [facility] Pager [phone]  If 7PM-7AM, please contact night-coverage [email or web address] Password [facility]  10/14/2019, 5:49 AM

## 2019-10-15 DIAGNOSIS — I4891 Unspecified atrial fibrillation: Secondary | ICD-10-CM

## 2019-10-15 LAB — CBC WITH DIFFERENTIAL/PLATELET
Abs Immature Granulocytes: 0.01 10*3/uL (ref 0.00–0.07)
Basophils Absolute: 0.1 10*3/uL (ref 0.0–0.1)
Basophils Relative: 1 %
Eosinophils Absolute: 0.1 10*3/uL (ref 0.0–0.5)
Eosinophils Relative: 1 %
HCT: 35.4 % — ABNORMAL LOW (ref 36.0–46.0)
Hemoglobin: 11.6 g/dL — ABNORMAL LOW (ref 12.0–15.0)
Immature Granulocytes: 0 %
Lymphocytes Relative: 80 %
Lymphs Abs: 11.1 10*3/uL — ABNORMAL HIGH (ref 0.7–4.0)
MCH: 30.1 pg (ref 26.0–34.0)
MCHC: 32.8 g/dL (ref 30.0–36.0)
MCV: 91.9 fL (ref 80.0–100.0)
Monocytes Absolute: 0.5 10*3/uL (ref 0.1–1.0)
Monocytes Relative: 3 %
Neutro Abs: 2 10*3/uL (ref 1.7–7.7)
Neutrophils Relative %: 15 %
Platelets: 361 10*3/uL (ref 150–400)
RBC: 3.85 MIL/uL — ABNORMAL LOW (ref 3.87–5.11)
RDW: 13.2 % (ref 11.5–15.5)
WBC: 13.8 10*3/uL — ABNORMAL HIGH (ref 4.0–10.5)
nRBC: 0 % (ref 0.0–0.2)

## 2019-10-15 LAB — LIPID PANEL
Cholesterol: 149 mg/dL (ref 0–200)
HDL: 39 mg/dL — ABNORMAL LOW (ref >40)
LDL Cholesterol: 80 mg/dL (ref 0–99)
Total CHOL/HDL Ratio: 3.8 RATIO
Triglycerides: 152 mg/dL — ABNORMAL HIGH (ref <150)
VLDL: 30 mg/dL (ref 0–40)

## 2019-10-15 LAB — BASIC METABOLIC PANEL
Anion gap: 8 (ref 5–15)
BUN: 9 mg/dL (ref 8–23)
CO2: 25 mmol/L (ref 22–32)
Calcium: 9.5 mg/dL (ref 8.9–10.3)
Chloride: 100 mmol/L (ref 98–111)
Creatinine, Ser: 0.66 mg/dL (ref 0.44–1.00)
GFR calc Af Amer: >60 mL/min (ref >60)
GFR calc non Af Amer: >60 mL/min (ref >60)
Glucose, Bld: 110 mg/dL — ABNORMAL HIGH (ref 70–99)
Potassium: 4.4 mmol/L (ref 3.5–5.1)
Sodium: 133 mmol/L — ABNORMAL LOW (ref 135–145)

## 2019-10-15 LAB — PATHOLOGIST SMEAR REVIEW

## 2019-10-15 LAB — HEMOGLOBIN A1C
Hgb A1c MFr Bld: 5.8 % — ABNORMAL HIGH (ref 4.8–5.6)
Mean Plasma Glucose: 119.76 mg/dL

## 2019-10-15 MED ORDER — APIXABAN 5 MG PO TABS: 5.0000 mg | ORAL_TABLET | Freq: Two times a day (BID) | ORAL | 0 refills | Status: AC

## 2019-10-15 MED ORDER — AMIODARONE HCL 100 MG PO TABS: 100.0000 mg | ORAL_TABLET | Freq: Every day | ORAL | 0 refills | Status: AC

## 2019-10-15 MED ORDER — ATORVASTATIN CALCIUM 40 MG PO TABS: 40.0000 mg | ORAL_TABLET | Freq: Every day | ORAL | 0 refills | Status: AC

## 2019-10-15 MED FILL — ATORVASTATIN CALCIUM 40 MG: 40 | 30 days supply | Qty: 30 | Fill #0

## 2019-10-15 MED FILL — AMIODARONE HCL 200 MG TAB: 200 | 30 days supply | Qty: 15 | Fill #0

## 2019-10-15 MED FILL — ELIQUIS 5 MG TABLET: 5 | 30 days supply | Qty: 60 | Fill #0

## 2019-10-15 NOTE — Progress Notes (Signed)
Stroke Team Progress Note    SUBJECTIVE Her RN is at the bedside.  Overall she feels her condition is completely resolved. Her stroke work-up is completed.  She is waiting to go home.  LDL cholesterol is slightly elevated at 80 mg percent and hemoglobin A1c is 5.8  OBJECTIVE Most recent Vital Signs: Vitals:   10/14/19 1611 10/14/19 2008 10/14/19 2316 10/15/19 0442  BP: (!) 126/50 (!) 147/62 (!) 144/60 (!) 155/70  Pulse:  (!) 59 60 61  Resp:  17 17 17   Temp: 99.1 F (37.3 C) 98 F (36.7 C) 97.9 F (36.6 C) 98.5 F (36.9 C)  TempSrc: Oral Oral Oral Oral  SpO2: 95% 96% 95% 95%  Weight:      Height:       CBG (last 3)  Recent Labs    10/13/19 2218  GLUCAP 137*    IV Fluid Intake:    MEDICATIONS  . amiodarone  100 mg Oral Daily  . apixaban  5 mg Oral BID  . ARIPiprazole  5 mg Oral Daily  . atorvastatin  80 mg Oral q1800  . clonazePAM  1 mg Oral QPM  . losartan  100 mg Oral Daily  . oxybutynin  5 mg Oral q morning - 10a  . pantoprazole  40 mg Oral Daily  . QUEtiapine  50 mg Oral QHS  . sertraline  100 mg Oral Daily  . zolpidem  2.5 mg Oral QHS   PRN:  acetaminophen **OR** acetaminophen (TYLENOL) oral liquid 160 mg/5 mL **OR** acetaminophen  Diet:   Diet Order            Diet Heart Room service appropriate? Yes; Fluid consistency: Thin  Diet effective now              CLINICALLY SIGNIFICANT STUDIES Basic Metabolic Panel:  Recent Labs  Lab 10/14/19 0606 10/15/19 0538  NA 134* 133*  K 4.0 4.4  CL 99 100  CO2 27 25  GLUCOSE 112* 110*  BUN 11 9  CREATININE 0.64 0.66  CALCIUM 9.5 9.5  MG 1.8  --    Liver Function Tests: No results for input(s): AST, ALT, ALKPHOS, BILITOT, PROT, ALBUMIN in the last 168 hours. CBC:  Recent Labs  Lab 10/14/19 0606 10/15/19 0538  WBC 17.1* 13.8*  NEUTROABS 3.1 2.0  HGB 12.0 11.6*  HCT 35.5* 35.4*  MCV 90.8 91.9  PLT 377 361   Coagulation:  Recent Labs  Lab 10/13/19 2237  LABPROT 13.8  INR 1.1   Cardiac  Enzymes: No results for input(s): CKTOTAL, CKMB, CKMBINDEX, TROPONINI in the last 168 hours. Urinalysis:  Recent Labs  Lab 10/13/19 2301  COLORURINE STRAW*  LABSPEC <1.005*  PHURINE 7.0  GLUCOSEU NEGATIVE  HGBUR TRACE*  BILIRUBINUR NEGATIVE  KETONESUR NEGATIVE  PROTEINUR 100*  NITRITE NEGATIVE  LEUKOCYTESUR NEGATIVE   Lipid Panel    Component Value Date/Time   CHOL 149 10/15/2019 0538   TRIG 152 (H) 10/15/2019 0538   HDL 39 (L) 10/15/2019 0538   CHOLHDL 3.8 10/15/2019 0538   VLDL 30 10/15/2019 0538   LDLCALC 80 10/15/2019 0538   HgbA1C  Lab Results  Component Value Date   HGBA1C 5.8 (H) 10/15/2019    Urine Drug Screen:      Component Value Date/Time   LABOPIA NONE DETECTED 10/13/2019 2302   COCAINSCRNUR NONE DETECTED 10/13/2019 2302   LABBENZ NONE DETECTED 10/13/2019 2302   AMPHETMU NONE DETECTED 10/13/2019 2302   THCU NONE DETECTED 10/13/2019 2302  LABBARB NONE DETECTED 10/13/2019 2302    Alcohol Level:  Recent Labs  Lab 10/13/19 2237  ETH <10    CT Angio Head W or Wo Contrast  Result Date: 10/13/2019 CLINICAL DATA:  Initial evaluation for acute dizziness, syncope. EXAM: CT ANGIOGRAPHY HEAD AND NECK TECHNIQUE: Multidetector CT imaging of the head and neck was performed using the standard protocol during bolus administration of intravenous contrast. Multiplanar CT image reconstructions and MIPs were obtained to evaluate the vascular anatomy. Carotid stenosis measurements (when applicable) are obtained utilizing NASCET criteria, using the distal internal carotid diameter as the denominator. CONTRAST:  135mL OMNIPAQUE IOHEXOL 350 MG/ML SOLN COMPARISON:  Prior head CT from earlier same day. FINDINGS: CTA NECK FINDINGS Aortic arch: Visualized aortic arch of normal caliber with normal branch pattern. Scattered atheromatous plaque noted about the arch itself. No hemodynamically significant stenosis seen about the origin the great vessels. Visualized subclavian arteries  widely patent. Right carotid system: Right common carotid artery widely patent from its origin to the bifurcation without stenosis. Mild atheromatous irregularity about the right bifurcation without stenosis. Right ICA widely patent distally to the skull base without stenosis, dissection or occlusion. Left carotid system: Left CCA widely patent from its origin to the bifurcation without stenosis. Mild atheromatous irregularity about the left bifurcation without stenosis. Left ICA widely patent distally to the skull base without stenosis, dissection or occlusion. Vertebral arteries: Both vertebral arteries arise from subclavian arteries. Left vertebral artery slightly dominant, with a diffusely hypoplastic right vertebral artery. Focal plaque at the origin left vertebral artery with no more than mild stenosis. Vertebral arteries otherwise widely patent within the neck without stenosis dissection or occlusion. Skeleton: No acute osseous abnormality. No discrete osseous lesions. Moderate cervical spondylosis noted at C3-4 through C6-7. Other neck: No other acute soft tissue abnormality within the neck. No mass lesion or adenopathy. Few scattered subcentimeter hypodense thyroid nodules noted, for which no follow-up imaging recommended given size and patient age. Upper chest: Visualized upper chest demonstrates no acute finding. Review of the MIP images confirms the above findings CTA HEAD FINDINGS Anterior circulation: Petrous segments widely patent bilaterally. Mild atheromatous change within the carotid siphons without significant stenosis. A1 segments patent bilaterally. Normal anterior communicating artery complex. Anterior cerebral arteries widely patent or distal aspects without stenosis. No M1 stenosis or occlusion. Normal MCA bifurcations. Distal MCA branches well perfused and symmetric. Posterior circulation: Vertebral arteries widely patent to the vertebrobasilar junction without stenosis. Posterior inferior  cerebral arteries patent bilaterally. Basilar widely patent to its distal aspect. Superior cerebral arteries patent bilaterally. Left PCA primarily supplied via the basilar. Fetal type origin of the right PCA. Both PCAs widely patent to their distal aspects. Venous sinuses: Patent. Anatomic variants: Fetal type origin right PCA. No intracranial aneurysm. Review of the MIP images confirms the above findings IMPRESSION: 1. Negative CTA for emergent large vessel occlusion. 2. Mild atheromatous change about the aortic arch, carotid bifurcations, and carotid siphons without hemodynamically significant or correctable stenosis. Electronically Signed   By: Jeannine Boga M.D.   On: 10/13/2019 23:22   CT Angio Neck W and/or Wo Contrast  Result Date: 10/13/2019 CLINICAL DATA:  Initial evaluation for acute dizziness, syncope. EXAM: CT ANGIOGRAPHY HEAD AND NECK TECHNIQUE: Multidetector CT imaging of the head and neck was performed using the standard protocol during bolus administration of intravenous contrast. Multiplanar CT image reconstructions and MIPs were obtained to evaluate the vascular anatomy. Carotid stenosis measurements (when applicable) are obtained utilizing NASCET criteria, using the  distal internal carotid diameter as the denominator. CONTRAST:  110mL OMNIPAQUE IOHEXOL 350 MG/ML SOLN COMPARISON:  Prior head CT from earlier same day. FINDINGS: CTA NECK FINDINGS Aortic arch: Visualized aortic arch of normal caliber with normal branch pattern. Scattered atheromatous plaque noted about the arch itself. No hemodynamically significant stenosis seen about the origin the great vessels. Visualized subclavian arteries widely patent. Right carotid system: Right common carotid artery widely patent from its origin to the bifurcation without stenosis. Mild atheromatous irregularity about the right bifurcation without stenosis. Right ICA widely patent distally to the skull base without stenosis, dissection or  occlusion. Left carotid system: Left CCA widely patent from its origin to the bifurcation without stenosis. Mild atheromatous irregularity about the left bifurcation without stenosis. Left ICA widely patent distally to the skull base without stenosis, dissection or occlusion. Vertebral arteries: Both vertebral arteries arise from subclavian arteries. Left vertebral artery slightly dominant, with a diffusely hypoplastic right vertebral artery. Focal plaque at the origin left vertebral artery with no more than mild stenosis. Vertebral arteries otherwise widely patent within the neck without stenosis dissection or occlusion. Skeleton: No acute osseous abnormality. No discrete osseous lesions. Moderate cervical spondylosis noted at C3-4 through C6-7. Other neck: No other acute soft tissue abnormality within the neck. No mass lesion or adenopathy. Few scattered subcentimeter hypodense thyroid nodules noted, for which no follow-up imaging recommended given size and patient age. Upper chest: Visualized upper chest demonstrates no acute finding. Review of the MIP images confirms the above findings CTA HEAD FINDINGS Anterior circulation: Petrous segments widely patent bilaterally. Mild atheromatous change within the carotid siphons without significant stenosis. A1 segments patent bilaterally. Normal anterior communicating artery complex. Anterior cerebral arteries widely patent or distal aspects without stenosis. No M1 stenosis or occlusion. Normal MCA bifurcations. Distal MCA branches well perfused and symmetric. Posterior circulation: Vertebral arteries widely patent to the vertebrobasilar junction without stenosis. Posterior inferior cerebral arteries patent bilaterally. Basilar widely patent to its distal aspect. Superior cerebral arteries patent bilaterally. Left PCA primarily supplied via the basilar. Fetal type origin of the right PCA. Both PCAs widely patent to their distal aspects. Venous sinuses: Patent. Anatomic  variants: Fetal type origin right PCA. No intracranial aneurysm. Review of the MIP images confirms the above findings IMPRESSION: 1. Negative CTA for emergent large vessel occlusion. 2. Mild atheromatous change about the aortic arch, carotid bifurcations, and carotid siphons without hemodynamically significant or correctable stenosis. Electronically Signed   By: Jeannine Boga M.D.   On: 10/13/2019 23:22   MR BRAIN WO CONTRAST  Result Date: 10/14/2019 CLINICAL DATA:  Initial evaluation for transient ischemic attack, sudden onset dizziness. EXAM: MRI HEAD WITHOUT CONTRAST TECHNIQUE: Multiplanar, multiecho pulse sequences of the brain and surrounding structures were obtained without intravenous contrast. COMPARISON:  Prior CT and CTA from 10/13/2019. FINDINGS: Brain: Mild age-related cerebral atrophy. Minimal patchy T2/FLAIR hyperintensity within the periventricular and deep white matter, most consistent with chronic small vessel ischemic disease, minimal for age. No abnormal foci of restricted diffusion to suggest acute or subacute ischemia. Gray-white matter differentiation maintained. No encephalomalacia to suggest chronic cortical infarction. No foci of susceptibility artifact to suggest acute or chronic intracranial hemorrhage. No mass lesion, midline shift or mass effect. No hydrocephalus. No extra-axial fluid collection. Pituitary gland suprasellar region normal. Midline structures intact. Vascular: Major intracranial vascular flow voids are maintained. Skull and upper cervical spine: Craniocervical junction within normal limits. Degenerative disc osteophyte noted at C3-4 with resultant moderate spinal stenosis. Bone marrow signal intensity  within normal limits. No scalp soft tissue abnormality. Sinuses/Orbits: Patient status post bilateral ocular lens replacement. Globes and orbital soft tissues demonstrate no acute finding. Paranasal sinuses are clear. Trace bilateral mastoid effusions, of doubtful  significance. Other: None. IMPRESSION: 1. No acute intracranial abnormality. 2. Age-related cerebral atrophy with mild chronic small vessel ischemic disease. 3. Degenerative disc osteophyte at C3-4 with resultant moderate spinal stenosis. Finding could be further assessed with dedicated MRI of the cervical spine as clinically desired. Electronically Signed   By: Jeannine Boga M.D.   On: 10/14/2019 03:54   DG Chest Portable 1 View  Result Date: 10/13/2019 CLINICAL DATA:  Infectious workup. Sudden onset of dizziness leading to syncope. EXAM: PORTABLE CHEST 1 VIEW COMPARISON:  Lung apices from neck CTA earlier this day. FINDINGS: Heart is normal in size. Aortic tortuosity, otherwise normal mediastinal contours. Aortic atherosclerosis. Pulmonary vasculature is normal. No consolidation, pleural effusion, or pneumothorax. No acute osseous abnormalities are seen. IMPRESSION: 1. No acute abnormality.  No evidence of intrathoracic infection. 2.  Aortic Atherosclerosis (ICD10-I70.0). Electronically Signed   By: Keith Rake M.D.   On: 10/13/2019 23:36   ECHOCARDIOGRAM COMPLETE  Result Date: 10/14/2019   ECHOCARDIOGRAM REPORT   Patient Name:   Karen Bradford Date of Exam: 10/14/2019 Medical Rec #:  [id number]   Height:       66.0 in Accession #:    [id number]  Weight:       150.0 lb Date of Birth:  12/21/1945   BSA:          1.77 m Patient Age:    42 years    BP:           136/72 mmHg Patient Gender: F           HR:           56 bpm. Exam Location:  Inpatient Procedure: 2D Echo, Cardiac Doppler and Color Doppler Indications:    I48.91* Unspeicified atrial fibrillation. TIA.  History:        Patient has no prior history of Echocardiogram examinations.                 TIA, Arrythmias:Atrial Fibrillation; Signs/Symptoms:Syncope.  Sonographer:    Roseanna Rainbow RDCS Referring Phys: [id number] [place]  1. Left ventricular ejection fraction, by visual estimation, is 60 to 65%. The left ventricle has  normal function. Left ventricular septal wall thickness was mildly increased. There is mildly increased left ventricular hypertrophy.  2. Left ventricular diastolic parameters are consistent with Grade II diastolic dysfunction (pseudonormalization).  3. The left ventricle has no regional wall motion abnormalities.  4. Global right ventricle has normal systolic function.The right ventricular size is normal. No increase in right ventricular wall thickness.  5. Left atrial size was normal.  6. Right atrial size was normal.  7. The mitral valve is normal in structure. Trivial mitral valve regurgitation.  8. The tricuspid valve is normal in structure. Tricuspid valve regurgitation is trivial.  9. The aortic valve is normal in structure. Aortic valve regurgitation is not visualized. 10. The pulmonic valve was grossly normal. Pulmonic valve regurgitation is not visualized. 11. Moderately elevated pulmonary artery systolic pressure. 12. The inferior vena cava is normal in size with greater than 50% respiratory variability, suggesting right atrial pressure of 3 mmHg. 13. The interatrial septum was not assessed. FINDINGS  Left Ventricle: Left ventricular ejection fraction, by visual estimation, is 60 to 65%. The left ventricle has normal function. The left ventricle  has no regional wall motion abnormalities. There is mildly increased left ventricular hypertrophy. Left ventricular diastolic parameters are consistent with Grade II diastolic dysfunction (pseudonormalization). Right Ventricle: The right ventricular size is normal. No increase in right ventricular wall thickness. Global RV systolic function is has normal systolic function. The tricuspid regurgitant velocity is 2.96 m/s, and with an assumed right atrial pressure  of 8 mmHg, the estimated right ventricular systolic pressure is moderately elevated at 43.0 mmHg. Left Atrium: Left atrial size was normal in size. Right Atrium: Right atrial size was normal in size  Pericardium: There is no evidence of pericardial effusion. Mitral Valve: The mitral valve is normal in structure. Trivial mitral valve regurgitation. MV peak gradient, 6.2 mmHg. Tricuspid Valve: The tricuspid valve is normal in structure. Tricuspid valve regurgitation is trivial. Aortic Valve: The aortic valve is normal in structure. Aortic valve regurgitation is not visualized. Pulmonic Valve: The pulmonic valve was grossly normal. Pulmonic valve regurgitation is not visualized. Pulmonic regurgitation is not visualized. Aorta: The aortic root is normal in size and structure. Venous: The inferior vena cava is normal in size with greater than 50% respiratory variability, suggesting right atrial pressure of 3 mmHg. IAS/Shunts: The interatrial septum was not assessed.  LEFT VENTRICLE PLAX 2D LVIDd:         3.60 cm       Diastology LVIDs:         2.30 cm       LV e' lateral:   5.08 cm/s LV PW:         1.50 cm       LV E/e' lateral: 25.6 LV IVS:        1.30 cm       LV e' medial:    6.18 cm/s LVOT diam:     1.90 cm       LV E/e' medial:  21.0 LV SV:         36 ml LV SV Index:   20.31 LVOT Area:     2.84 cm  LV Volumes (MOD) LV area d, A2C:    20.70 cm LV area d, A4C:    22.00 cm LV area s, A2C:    12.00 cm LV area s, A4C:    12.30 cm LV major d, A2C:   6.62 cm LV major d, A4C:   6.78 cm LV major s, A2C:   5.61 cm LV major s, A4C:   5.76 cm LV vol d, MOD A2C: 53.1 ml LV vol d, MOD A4C: 58.6 ml LV vol s, MOD A2C: 21.2 ml LV vol s, MOD A4C: 22.6 ml LV SV MOD A2C:     31.9 ml LV SV MOD A4C:     58.6 ml LV SV MOD BP:      34.2 ml RIGHT VENTRICLE            IVC RV S prime:     9.59 cm/s  IVC diam: 2.50 cm TAPSE (M-mode): 1.8 cm LEFT ATRIUM             Index       RIGHT ATRIUM           Index LA diam:        3.40 cm 1.92 cm/m  RA Area:     15.10 cm LA Vol (A2C):   38.4 ml 21.70 ml/m RA Volume:   34.20 ml  19.33 ml/m LA Vol (A4C):   43.8 ml 24.75 ml/m LA Biplane Vol: 43.6  ml 24.64 ml/m  AORTIC VALVE LVOT Vmax:   73.80  cm/s LVOT Vmean:  46.000 cm/s LVOT VTI:    0.140 m  AORTA Ao Root diam: 2.80 cm Ao Asc diam:  3.30 cm MITRAL VALVE                         TRICUSPID VALVE MV Area (PHT): 3.21 cm              TR Peak grad:   35.0 mmHg MV Peak grad:  6.2 mmHg              TR Vmax:        296.00 cm/s MV Mean grad:  2.0 mmHg MV Vmax:       1.25 m/s              SHUNTS MV Vmean:      68.8 cm/s             Systemic VTI:  0.14 m MV VTI:        0.42 m                Systemic Diam: 1.90 cm MV PHT:        68.44 msec MV Decel Time: 236 msec MV E velocity: 130.00 cm/s 103 cm/s MV A velocity: 40.40 cm/s  70.3 cm/s MV E/A ratio:  3.22        1.5  Charolette Forward MD Electronically signed by Charolette Forward MD Signature Date/Time: 10/14/2019/10:42:41 AM    Final    CT HEAD CODE STROKE WO CONTRAST  Result Date: 10/13/2019 CLINICAL DATA:  Code stroke. Initial evaluation for acute dizziness, syncope. EXAM: CT HEAD WITHOUT CONTRAST TECHNIQUE: Contiguous axial images were obtained from the base of the skull through the vertex without intravenous contrast. COMPARISON:  Prior head CT from 09/10/2005. FINDINGS: Brain: Generalized age-related cerebral atrophy with mild chronic small vessel ischemic disease. No acute intracranial hemorrhage. No acute large vessel territory infarct. No mass lesion, midline shift or mass effect. No hydrocephalus. No extra-axial fluid collection. Small dilated perivascular space noted at the inferior right basal ganglia. Vascular: No hyperdense vessel. Skull: Scalp soft tissues and calvarium within normal limits. Sinuses/Orbits: Globes orbital soft tissues normal. Paranasal sinuses mastoid air cells are clear. Other: None. ASPECTS [facility] Stroke Program Early CT Score) - Ganglionic level infarction (caudate, lentiform nuclei, internal capsule, insula, M1-M3 cortex): 7 - Supraganglionic infarction (M4-M6 cortex): 3 Total score (0-10 with 10 being normal): 10 IMPRESSION: 1. No acute intracranial infarct or other abnormality  identified. 2. ASPECTS is 10. 3. Age-related cerebral atrophy with mild chronic small vessel ischemic disease. Critical Value/emergent results were called by telephone at the time of interpretation on 10/13/2019 at 10:42 pm to providerMATTHEW TRIFAN , who verbally acknowledged these results. Electronically Signed   By: Jeannine Boga M.D.   On: 10/13/2019 22:43    Therapy Recommendations none  Physical Exam Pleasant frail elderly Caucasian lady not in distress.Pleasant elderly Caucasian lady not in distress. . Afebrile. Head is nontraumatic. Neck is supple without bruit.    Cardiac exam no murmur or gallop. Lungs are clear to auscultation. Distal pulses are well felt.  Neurological Exam ;  Awake  Alert oriented x 3. Normal speech and language.eye movements full without nystagmus.fundi were not visualized. Vision acuity and fields appear normal. Hearing is normal. Palatal movements are normal. Face symmetric. Tongue midline. Normal strength, tone, reflexes and coordination. Normal sensation. Gait deferred.  ASSESSMENT Ms. Karen Bradford is a 73 y.o. female with history of  breast cancer, anxiety, depression, polyneuropathic pain, chronic lymphocytic leukemia, and hypertension (not on anti-htn meds) transferred from [facility] to [facility] after she presented there for evaluation of an episode of syncope and found to have newly diagnosed atrial fibrillation.  She did not receive IV t-PA due to NIHSSS of 0.  Transient left hemiplegia secondary to right hemispheric TIA from new onset A. fib presenting with syncope.   Resultant no deficits  Code Stroke CT Head - not ordered  CT head - no acute findings.  MRI head  - no acute findings.  MRA head - not ordered  CTA H&N - negative  CT Perfusion - not ordered  Carotid Doppler - CTA neck performed - carotid dopplers not indicated.  2D Echo -normal ejection fraction.  No clot.  Sars Corona Virus 2 - negative  LDL - 80 mg  %  HgbA1c - 5.8  UDS - negative  VTE prophylaxis - Lovenox  Hospital day # 1  TREATMENT/PLAN  Continue  Eliquis (apixaban) daily for secondary stroke prevention.  Litpior 80 mg daily for elevated lipids Follow-up as an outpatient with stroke clinic in 6 weeks. Stroke team will sign off. Kindly call for questions.  SIGNED Antony Contras, MD   To contact Stroke Continuity provider, please refer to [email or web address] After hours, contact General Neurology

## 2019-10-15 NOTE — Care Management (Addendum)
Called [facility] ( [place]), spoke with Val. Patient's visits have ended. She will need a progress note from attending MD, stating it is OK to resume therapy faxed to her at [phone]. Note faxed   Magdalen Spatz RN

## 2019-10-15 NOTE — TOC Benefit Eligibility Note (Signed)
Transition of Care [facility]) Benefit Eligibility Note    Patient Details  Name: Karen Bradford MRN: [id number] Date of Birth: July 15, 1946   Medication/Dose: ELIQUIS  5 MG BID  Covered?: Yes  Tier: 3 Drug  Prescription Coverage Preferred Pharmacy: [place] with Person/Company/Phone Number:: [facility]  @   [employer]  RX # [phone]  Co-Pay: $37.00  Prior Approval: No  Deductible: Met       Memory Argue Phone Number: 10/15/2019, 9:53 AM

## 2019-10-15 NOTE — Progress Notes (Signed)
Ok to resume therapy.

## 2019-10-15 NOTE — TOC Initial Note (Signed)
Transition of Care [facility]) - Initial/Assessment Note    Patient Details  Name: Karen Bradford MRN: [id number] Date of Birth: 08-01-1946  Transition of Care [facility]) CM/SW Contact:    Alexander Mt, [place] Phone Number: 10/15/2019, 11:13 AM  Clinical Narrative:                 CSW met with pt at bedside, introduced self, role, reason for visit.  Pt from home with spouse, confirmed home address and PCP. Pt aware that she should have f/u with her PCP and Cardiology MDs. Pt amenable to CSW scheduling appt with her PCP. Pt aware her medications will be brought to her from pharmacy. She is agreeable to continuing OPPT at [facility] in [facility] (she has been seeing Luvenia Heller there).   CSW to make PCP appt, if able will send resumption orders to [facility] in [place]. Pt stable for dc, TOC available should any additional needs arise.   Expected Discharge Plan: OP Rehab Barriers to Discharge: No Barriers Identified   Patient Goals and CMS Choice Patient states their goals for this hospitalization and ongoing recovery are:: to get home CMS Medicare.gov Compare Post Acute Care list provided to:: (n/a) Choice offered to / list presented to : Patient  Expected Discharge Plan and Services Expected Discharge Plan: OP Rehab In-house Referral: Clinical Social Work Discharge Planning Services: CM Consult Living arrangements for the past 2 months: Single Family Home Expected Discharge Date: 10/15/19                [facility] Arranged: NA   Prior Living Arrangements/Services Living arrangements for the past 2 months: Single Family Home Lives with:: Spouse Patient language and need for interpreter reviewed:: Yes(no needs) Do you feel safe going back to the place where you live?: Yes      Need for Family Participation in Patient Care: Yes (Comment)(assistance as required with ADL/IADLs) Care giver support system in place?: Yes (comment)(pt husband)      Activities of Daily Living Home Assistive Devices/Equipment:  None ADL Screening (condition at time of admission) Patient's cognitive ability adequate to safely complete daily activities?: Yes Is the patient deaf or have difficulty hearing?: No Does the patient have difficulty seeing, even when wearing glasses/contacts?: No Does the patient have difficulty concentrating, remembering, or making decisions?: No Patient able to express need for assistance with ADLs?: Yes Does the patient have difficulty dressing or bathing?: No Independently performs ADLs?: Yes (appropriate for developmental age) Does the patient have difficulty walking or climbing stairs?: No Weakness of Legs: Both Weakness of Arms/Hands: None  Permission Sought/Granted Permission sought to share information with : PCP Permission granted to share information with : Yes, Verbal Permission Granted     Permission granted to share info w AGENCY: PCP        Emotional Assessment Appearance:: Appears younger than stated age Attitude/Demeanor/Rapport: Engaged, Gracious Affect (typically observed): Accepting, Adaptable, Appropriate, Pleasant Orientation: : Oriented to Self, Oriented to Place, Oriented to  Time, Oriented to Situation Alcohol / Substance Use: Not Applicable Psych Involvement: No (comment)  Admission diagnosis:  TIA (transient ischemic attack) [G45.9] Syncope, unspecified syncope type [R55] Atrial fibrillation, unspecified type [facility]) [I48.91] Patient Active Problem List   Diagnosis Date Noted  . Syncope 10/14/2019  . New onset atrial fibrillation ([place]) 10/14/2019  . Leukocytosis 10/14/2019  . Hyponatremia 10/14/2019  . TIA (transient ischemic attack) 10/13/2019   PCP:  Drake Leach, MD Pharmacy:   [place] [place] -  [address] [place] [address] Phone: [phone] Fax: [phone]  [facility] [place] [place] - [address] [place] [address] Phone:  [phone] Fax: [phone]  Readmission Risk Interventions Readmission Risk Prevention Plan 10/15/2019  Post Dischage Appt Complete  Medication Screening Complete  Transportation Screening Complete  Some recent data might be hidden

## 2019-10-15 NOTE — Discharge Summary (Signed)
Physician Discharge Summary  Sherrin Stahle JAS:[id number] DOB: [birth year] DOA: 10/13/2019  PCP: Drake Leach, MD  Admit date: 10/13/2019 Discharge date: 10/15/2019  Admitted From: Home Disposition: Home  Recommendations for Outpatient Follow-up:  1. Follow up with PCP in 1-2 weeks 2. Follow with cardiology in 1 to 2 weeks 3. Please obtain BMP/CBC in one week 4. Please follow up on the following pending results:  Home Health: None Equipment/Devices: None  Discharge Condition: Stable CODE STATUS: Full code Diet recommendation: Cardiac  Subjective: Seen and examined.  She feels much better.  She has no complaints at all.  Brief/Interim Summary: Rutha Melgoza a 73 y.o.femalewith medical history significant ofhypertension, anxiety, depression, history ofbreast cancer andCLL presented to [facility] for evaluation of syncope. Husband reported to ED provider that patient was at the dinner table and at around 8:45 PM had a syncopal episode which lasted a few minutes. No seizure-like activity noted.  No other symptoms.  No history of seizure in the past.  On arrival to the ED, patient noted to have left-sided facial droop, slurred speech, and abnormal finger-to-nose test. Code stroke was activated. Telemetry neurology evaluated the patient and felt her NIHSS was 0. Recommended formal stroke work-up. Head CT negative for acute infarct. CTA head and neck negative for LVO. Brain MRI showing no acute intracranial abnormality. EKG showing new onset A. fib, rate controlled. Labs revealed WBC count 22.2. Rapid SARS-CoV-2 antigen test negative. Chest x-ray not suggestive of pneumonia. Corrected sodium 130, potassium 3.4. Blood glucose 159. High-sensitivity troponin negative. Blood ethanol level undetectable. UA not suggestive of infection. UDSnegative.  She was admitted under hospital service for further work-up.  Cardiology was consulted.  She was started on amiodarone as well as  Eliquis.  She converted to sinus rhythm.  All her symptoms has resolved.  She has no focal neurological deficit or any facial droop or any slurred speech now.  She has been cleared by neurology as well as cardiology to go home.  She is being discharged on Eliquis, amiodarone as well as atorvastatin 40 mg daily per cardiology and neurology recommendations and she will follow with PCP within 1 week and cardiology in 1 to 2 weeks.  Of note, patient has been informed about risk and benefits of anticoagulant, Eliquis and she verbalized understanding of that and agreed to be placed on that.  Discharge Diagnoses:  Principal Problem:   Syncope Active Problems:   TIA (transient ischemic attack)   New onset atrial fibrillation (HCC)   Leukocytosis   Hyponatremia    Discharge Instructions  Discharge Instructions    Discharge patient   Complete by: As directed    Discharge disposition: 01-Home or Self Care   Discharge patient date: 10/15/2019     Allergies as of 10/15/2019      Reactions   Colesevelam Rash      Medication List    TAKE these medications   acyclovir 400 MG tablet Commonly known as: ZOVIRAX Take 400 mg by mouth as needed (rosacea).   alclomethasone 0.05 % cream Commonly known as: ACLOVATE Apply 1 application topically daily.   amiodarone 100 MG tablet Commonly known as: PACERONE Take 1 tablet (100 mg total) by mouth daily. Start taking on: October 16, 2019   apixaban 5 MG Tabs tablet Commonly known as: ELIQUIS Take 1 tablet (5 mg total) by mouth 2 (two) times daily.   ARIPiprazole 5 MG tablet Commonly known as: ABILIFY Take 5 mg by mouth daily.   atorvastatin 40 MG tablet  Commonly known as: Lipitor Take 1 tablet (40 mg total) by mouth daily.   brimonidine 0.2 % ophthalmic solution Commonly known as: ALPHAGAN Place 1 drop into both eyes daily.   cholecalciferol 1000 units tablet Commonly known as: VITAMIN D Take 1,000 Units by mouth daily.   clonazePAM  1 MG tablet Commonly known as: KLONOPIN Take 1 mg by mouth every evening.   dorzolamide-timolol 22.3-6.8 MG/ML ophthalmic solution Commonly known as: COSOPT Place 1 drop into both eyes daily.   losartan 100 MG tablet Commonly known as: COZAAR Take 100 mg by mouth daily.   magnesium oxide 400 MG tablet Commonly known as: MAG-OX Take 400 mg by mouth every morning.   omeprazole 40 MG capsule Commonly known as: PRILOSEC Take 40 mg by mouth every morning.   oxybutynin 5 MG tablet Commonly known as: DITROPAN Take 5 mg by mouth every morning.   QUEtiapine 100 MG tablet Commonly known as: SEROQUEL Take 50 mg by mouth at bedtime.   sertraline 100 MG tablet Commonly known as: ZOLOFT Take 100 mg by mouth daily.   timolol 0.5 % ophthalmic solution Commonly known as: TIMOPTIC Place 1 drop into both eyes daily.   zolpidem 5 MG tablet Commonly known as: AMBIEN Take 2.5 mg by mouth at bedtime.      Follow-up Information    Drake Leach, MD Follow up in 1 week(s).   Specialty: Internal Medicine Contact information: [address] [place] [address] [phone]        Charolette Forward, MD Follow up in 2 week(s).   Specialty: Cardiology Contact information: [place] [address] [phone]          Allergies  Allergen Reactions  . Colesevelam Rash    Consultations: Neurology and cardiology   Procedures/Studies: CT Angio Head W or Wo Contrast  Result Date: 10/13/2019 CLINICAL DATA:  Initial evaluation for acute dizziness, syncope. EXAM: CT ANGIOGRAPHY HEAD AND NECK TECHNIQUE: Multidetector CT imaging of the head and neck was performed using the standard protocol during bolus administration of intravenous contrast. Multiplanar CT image reconstructions and MIPs were obtained to evaluate the vascular anatomy. Carotid stenosis measurements (when applicable) are obtained utilizing NASCET criteria, using the distal internal  carotid diameter as the denominator. CONTRAST:  147m OMNIPAQUE IOHEXOL 350 MG/ML SOLN COMPARISON:  Prior head CT from earlier same day. FINDINGS: CTA NECK FINDINGS Aortic arch: Visualized aortic arch of normal caliber with normal branch pattern. Scattered atheromatous plaque noted about the arch itself. No hemodynamically significant stenosis seen about the origin the great vessels. Visualized subclavian arteries widely patent. Right carotid system: Right common carotid artery widely patent from its origin to the bifurcation without stenosis. Mild atheromatous irregularity about the right bifurcation without stenosis. Right ICA widely patent distally to the skull base without stenosis, dissection or occlusion. Left carotid system: Left CCA widely patent from its origin to the bifurcation without stenosis. Mild atheromatous irregularity about the left bifurcation without stenosis. Left ICA widely patent distally to the skull base without stenosis, dissection or occlusion. Vertebral arteries: Both vertebral arteries arise from subclavian arteries. Left vertebral artery slightly dominant, with a diffusely hypoplastic right vertebral artery. Focal plaque at the origin left vertebral artery with no more than mild stenosis. Vertebral arteries otherwise widely patent within the neck without stenosis dissection or occlusion. Skeleton: No acute osseous abnormality. No discrete osseous lesions. Moderate cervical spondylosis noted at C3-4 through C6-7. Other neck: No other acute soft tissue abnormality within  the neck. No mass lesion or adenopathy. Few scattered subcentimeter hypodense thyroid nodules noted, for which no follow-up imaging recommended given size and patient age. Upper chest: Visualized upper chest demonstrates no acute finding. Review of the MIP images confirms the above findings CTA HEAD FINDINGS Anterior circulation: Petrous segments widely patent bilaterally. Mild atheromatous change within the carotid  siphons without significant stenosis. A1 segments patent bilaterally. Normal anterior communicating artery complex. Anterior cerebral arteries widely patent or distal aspects without stenosis. No M1 stenosis or occlusion. Normal MCA bifurcations. Distal MCA branches well perfused and symmetric. Posterior circulation: Vertebral arteries widely patent to the vertebrobasilar junction without stenosis. Posterior inferior cerebral arteries patent bilaterally. Basilar widely patent to its distal aspect. Superior cerebral arteries patent bilaterally. Left PCA primarily supplied via the basilar. Fetal type origin of the right PCA. Both PCAs widely patent to their distal aspects. Venous sinuses: Patent. Anatomic variants: Fetal type origin right PCA. No intracranial aneurysm. Review of the MIP images confirms the above findings IMPRESSION: 1. Negative CTA for emergent large vessel occlusion. 2. Mild atheromatous change about the aortic arch, carotid bifurcations, and carotid siphons without hemodynamically significant or correctable stenosis. Electronically Signed   By: Jeannine Boga M.D.   On: 10/13/2019 23:22   CT Angio Neck W and/or Wo Contrast  Result Date: 10/13/2019 CLINICAL DATA:  Initial evaluation for acute dizziness, syncope. EXAM: CT ANGIOGRAPHY HEAD AND NECK TECHNIQUE: Multidetector CT imaging of the head and neck was performed using the standard protocol during bolus administration of intravenous contrast. Multiplanar CT image reconstructions and MIPs were obtained to evaluate the vascular anatomy. Carotid stenosis measurements (when applicable) are obtained utilizing NASCET criteria, using the distal internal carotid diameter as the denominator. CONTRAST:  124m OMNIPAQUE IOHEXOL 350 MG/ML SOLN COMPARISON:  Prior head CT from earlier same day. FINDINGS: CTA NECK FINDINGS Aortic arch: Visualized aortic arch of normal caliber with normal branch pattern. Scattered atheromatous plaque noted about the  arch itself. No hemodynamically significant stenosis seen about the origin the great vessels. Visualized subclavian arteries widely patent. Right carotid system: Right common carotid artery widely patent from its origin to the bifurcation without stenosis. Mild atheromatous irregularity about the right bifurcation without stenosis. Right ICA widely patent distally to the skull base without stenosis, dissection or occlusion. Left carotid system: Left CCA widely patent from its origin to the bifurcation without stenosis. Mild atheromatous irregularity about the left bifurcation without stenosis. Left ICA widely patent distally to the skull base without stenosis, dissection or occlusion. Vertebral arteries: Both vertebral arteries arise from subclavian arteries. Left vertebral artery slightly dominant, with a diffusely hypoplastic right vertebral artery. Focal plaque at the origin left vertebral artery with no more than mild stenosis. Vertebral arteries otherwise widely patent within the neck without stenosis dissection or occlusion. Skeleton: No acute osseous abnormality. No discrete osseous lesions. Moderate cervical spondylosis noted at C3-4 through C6-7. Other neck: No other acute soft tissue abnormality within the neck. No mass lesion or adenopathy. Few scattered subcentimeter hypodense thyroid nodules noted, for which no follow-up imaging recommended given size and patient age. Upper chest: Visualized upper chest demonstrates no acute finding. Review of the MIP images confirms the above findings CTA HEAD FINDINGS Anterior circulation: Petrous segments widely patent bilaterally. Mild atheromatous change within the carotid siphons without significant stenosis. A1 segments patent bilaterally. Normal anterior communicating artery complex. Anterior cerebral arteries widely patent or distal aspects without stenosis. No M1 stenosis or occlusion. Normal MCA bifurcations. Distal MCA branches well perfused  and symmetric.  Posterior circulation: Vertebral arteries widely patent to the vertebrobasilar junction without stenosis. Posterior inferior cerebral arteries patent bilaterally. Basilar widely patent to its distal aspect. Superior cerebral arteries patent bilaterally. Left PCA primarily supplied via the basilar. Fetal type origin of the right PCA. Both PCAs widely patent to their distal aspects. Venous sinuses: Patent. Anatomic variants: Fetal type origin right PCA. No intracranial aneurysm. Review of the MIP images confirms the above findings IMPRESSION: 1. Negative CTA for emergent large vessel occlusion. 2. Mild atheromatous change about the aortic arch, carotid bifurcations, and carotid siphons without hemodynamically significant or correctable stenosis. Electronically Signed   By: Jeannine Boga M.D.   On: 10/13/2019 23:22   MR BRAIN WO CONTRAST  Result Date: 10/14/2019 CLINICAL DATA:  Initial evaluation for transient ischemic attack, sudden onset dizziness. EXAM: MRI HEAD WITHOUT CONTRAST TECHNIQUE: Multiplanar, multiecho pulse sequences of the brain and surrounding structures were obtained without intravenous contrast. COMPARISON:  Prior CT and CTA from 10/13/2019. FINDINGS: Brain: Mild age-related cerebral atrophy. Minimal patchy T2/FLAIR hyperintensity within the periventricular and deep white matter, most consistent with chronic small vessel ischemic disease, minimal for age. No abnormal foci of restricted diffusion to suggest acute or subacute ischemia. Gray-white matter differentiation maintained. No encephalomalacia to suggest chronic cortical infarction. No foci of susceptibility artifact to suggest acute or chronic intracranial hemorrhage. No mass lesion, midline shift or mass effect. No hydrocephalus. No extra-axial fluid collection. Pituitary gland suprasellar region normal. Midline structures intact. Vascular: Major intracranial vascular flow voids are maintained. Skull and upper cervical spine:  Craniocervical junction within normal limits. Degenerative disc osteophyte noted at C3-4 with resultant moderate spinal stenosis. Bone marrow signal intensity within normal limits. No scalp soft tissue abnormality. Sinuses/Orbits: Patient status post bilateral ocular lens replacement. Globes and orbital soft tissues demonstrate no acute finding. Paranasal sinuses are clear. Trace bilateral mastoid effusions, of doubtful significance. Other: None. IMPRESSION: 1. No acute intracranial abnormality. 2. Age-related cerebral atrophy with mild chronic small vessel ischemic disease. 3. Degenerative disc osteophyte at C3-4 with resultant moderate spinal stenosis. Finding could be further assessed with dedicated MRI of the cervical spine as clinically desired. Electronically Signed   By: Jeannine Boga M.D.   On: 10/14/2019 03:54   DG Chest Portable 1 View  Result Date: 10/13/2019 CLINICAL DATA:  Infectious workup. Sudden onset of dizziness leading to syncope. EXAM: PORTABLE CHEST 1 VIEW COMPARISON:  Lung apices from neck CTA earlier this day. FINDINGS: Heart is normal in size. Aortic tortuosity, otherwise normal mediastinal contours. Aortic atherosclerosis. Pulmonary vasculature is normal. No consolidation, pleural effusion, or pneumothorax. No acute osseous abnormalities are seen. IMPRESSION: 1. No acute abnormality.  No evidence of intrathoracic infection. 2.  Aortic Atherosclerosis (ICD10-I70.0). Electronically Signed   By: Keith Rake M.D.   On: 10/13/2019 23:36   ECHOCARDIOGRAM COMPLETE  Result Date: 10/14/2019   ECHOCARDIOGRAM REPORT   Patient Name:   ELARA COCKE Date of Exam: 10/14/2019 Medical Rec #:  [id number]   Height:       66.0 in Accession #:    [id number]  Weight:       150.0 lb Date of Birth:  11-May-1946   BSA:          1.77 m Patient Age:    58 years    BP:           136/72 mmHg Patient Gender: F           HR:  56 bpm. Exam Location:  Inpatient Procedure: 2D Echo, Cardiac Doppler  and Color Doppler Indications:    I48.91* Unspeicified atrial fibrillation. TIA.  History:        Patient has no prior history of Echocardiogram examinations.                 TIA, Arrythmias:Atrial Fibrillation; Signs/Symptoms:Syncope.  Sonographer:    Roseanna Rainbow RDCS Referring Phys: [id number] [place]  1. Left ventricular ejection fraction, by visual estimation, is 60 to 65%. The left ventricle has normal function. Left ventricular septal wall thickness was mildly increased. There is mildly increased left ventricular hypertrophy.  2. Left ventricular diastolic parameters are consistent with Grade II diastolic dysfunction (pseudonormalization).  3. The left ventricle has no regional wall motion abnormalities.  4. Global right ventricle has normal systolic function.The right ventricular size is normal. No increase in right ventricular wall thickness.  5. Left atrial size was normal.  6. Right atrial size was normal.  7. The mitral valve is normal in structure. Trivial mitral valve regurgitation.  8. The tricuspid valve is normal in structure. Tricuspid valve regurgitation is trivial.  9. The aortic valve is normal in structure. Aortic valve regurgitation is not visualized. 10. The pulmonic valve was grossly normal. Pulmonic valve regurgitation is not visualized. 11. Moderately elevated pulmonary artery systolic pressure. 12. The inferior vena cava is normal in size with greater than 50% respiratory variability, suggesting right atrial pressure of 3 mmHg. 13. The interatrial septum was not assessed. FINDINGS  Left Ventricle: Left ventricular ejection fraction, by visual estimation, is 60 to 65%. The left ventricle has normal function. The left ventricle has no regional wall motion abnormalities. There is mildly increased left ventricular hypertrophy. Left ventricular diastolic parameters are consistent with Grade II diastolic dysfunction (pseudonormalization). Right Ventricle: The right ventricular  size is normal. No increase in right ventricular wall thickness. Global RV systolic function is has normal systolic function. The tricuspid regurgitant velocity is 2.96 m/s, and with an assumed right atrial pressure  of 8 mmHg, the estimated right ventricular systolic pressure is moderately elevated at 43.0 mmHg. Left Atrium: Left atrial size was normal in size. Right Atrium: Right atrial size was normal in size Pericardium: There is no evidence of pericardial effusion. Mitral Valve: The mitral valve is normal in structure. Trivial mitral valve regurgitation. MV peak gradient, 6.2 mmHg. Tricuspid Valve: The tricuspid valve is normal in structure. Tricuspid valve regurgitation is trivial. Aortic Valve: The aortic valve is normal in structure. Aortic valve regurgitation is not visualized. Pulmonic Valve: The pulmonic valve was grossly normal. Pulmonic valve regurgitation is not visualized. Pulmonic regurgitation is not visualized. Aorta: The aortic root is normal in size and structure. Venous: The inferior vena cava is normal in size with greater than 50% respiratory variability, suggesting right atrial pressure of 3 mmHg. IAS/Shunts: The interatrial septum was not assessed.  LEFT VENTRICLE PLAX 2D LVIDd:         3.60 cm       Diastology LVIDs:         2.30 cm       LV e' lateral:   5.08 cm/s LV PW:         1.50 cm       LV E/e' lateral: 25.6 LV IVS:        1.30 cm       LV e' medial:    6.18 cm/s LVOT diam:     1.90 cm  LV E/e' medial:  21.0 LV SV:         36 ml LV SV Index:   20.31 LVOT Area:     2.84 cm  LV Volumes (MOD) LV area d, A2C:    20.70 cm LV area d, A4C:    22.00 cm LV area s, A2C:    12.00 cm LV area s, A4C:    12.30 cm LV major d, A2C:   6.62 cm LV major d, A4C:   6.78 cm LV major s, A2C:   5.61 cm LV major s, A4C:   5.76 cm LV vol d, MOD A2C: 53.1 ml LV vol d, MOD A4C: 58.6 ml LV vol s, MOD A2C: 21.2 ml LV vol s, MOD A4C: 22.6 ml LV SV MOD A2C:     31.9 ml LV SV MOD A4C:     58.6 ml LV SV MOD  BP:      34.2 ml RIGHT VENTRICLE            IVC RV S prime:     9.59 cm/s  IVC diam: 2.50 cm TAPSE (M-mode): 1.8 cm LEFT ATRIUM             Index       RIGHT ATRIUM           Index LA diam:        3.40 cm 1.92 cm/m  RA Area:     15.10 cm LA Vol (A2C):   38.4 ml 21.70 ml/m RA Volume:   34.20 ml  19.33 ml/m LA Vol (A4C):   43.8 ml 24.75 ml/m LA Biplane Vol: 43.6 ml 24.64 ml/m  AORTIC VALVE LVOT Vmax:   73.80 cm/s LVOT Vmean:  46.000 cm/s LVOT VTI:    0.140 m  AORTA Ao Root diam: 2.80 cm Ao Asc diam:  3.30 cm MITRAL VALVE                         TRICUSPID VALVE MV Area (PHT): 3.21 cm              TR Peak grad:   35.0 mmHg MV Peak grad:  6.2 mmHg              TR Vmax:        296.00 cm/s MV Mean grad:  2.0 mmHg MV Vmax:       1.25 m/s              SHUNTS MV Vmean:      68.8 cm/s             Systemic VTI:  0.14 m MV VTI:        0.42 m                Systemic Diam: 1.90 cm MV PHT:        68.44 msec MV Decel Time: 236 msec MV E velocity: 130.00 cm/s 103 cm/s MV A velocity: 40.40 cm/s  70.3 cm/s MV E/A ratio:  3.22        1.5  Charolette Forward MD Electronically signed by Charolette Forward MD Signature Date/Time: 10/14/2019/10:42:41 AM    Final    CT HEAD CODE STROKE WO CONTRAST  Result Date: 10/13/2019 CLINICAL DATA:  Code stroke. Initial evaluation for acute dizziness, syncope. EXAM: CT HEAD WITHOUT CONTRAST TECHNIQUE: Contiguous axial images were obtained from the base of the skull through the vertex without intravenous contrast. COMPARISON:  Prior  head CT from 09/10/2005. FINDINGS: Brain: Generalized age-related cerebral atrophy with mild chronic small vessel ischemic disease. No acute intracranial hemorrhage. No acute large vessel territory infarct. No mass lesion, midline shift or mass effect. No hydrocephalus. No extra-axial fluid collection. Small dilated perivascular space noted at the inferior right basal ganglia. Vascular: No hyperdense vessel. Skull: Scalp soft tissues and calvarium within normal limits.  Sinuses/Orbits: Globes orbital soft tissues normal. Paranasal sinuses mastoid air cells are clear. Other: None. ASPECTS [facility] Stroke Program Early CT Score) - Ganglionic level infarction (caudate, lentiform nuclei, internal capsule, insula, M1-M3 cortex): 7 - Supraganglionic infarction (M4-M6 cortex): 3 Total score (0-10 with 10 being normal): 10 IMPRESSION: 1. No acute intracranial infarct or other abnormality identified. 2. ASPECTS is 10. 3. Age-related cerebral atrophy with mild chronic small vessel ischemic disease. Critical Value/emergent results were called by telephone at the time of interpretation on 10/13/2019 at 10:42 pm to providerMATTHEW TRIFAN , who verbally acknowledged these results. Electronically Signed   By: Jeannine Boga M.D.   On: 10/13/2019 22:43      Discharge Exam: Vitals:   10/14/19 2316 10/15/19 0442  BP: (!) 144/60 (!) 155/70  Pulse: 60 61  Resp: 17 17  Temp: 97.9 F (36.6 C) 98.5 F (36.9 C)  SpO2: 95% 95%   Vitals:   10/14/19 1611 10/14/19 2008 10/14/19 2316 10/15/19 0442  BP: (!) 126/50 (!) 147/62 (!) 144/60 (!) 155/70  Pulse:  (!) 59 60 61  Resp:  '17 17 17  '$ Temp: 99.1 F (37.3 C) 98 F (36.7 C) 97.9 F (36.6 C) 98.5 F (36.9 C)  TempSrc: Oral Oral Oral Oral  SpO2: 95% 96% 95% 95%  Weight:      Height:        General: Pt is alert, awake, not in acute distress Cardiovascular: RRR, S1/S2 +, no rubs, no gallops Respiratory: CTA bilaterally, no wheezing, no rhonchi Abdominal: Soft, NT, ND, bowel sounds + Extremities: no edema, no cyanosis    The results of significant diagnostics from this hospitalization (including imaging, microbiology, ancillary and laboratory) are listed below for reference.     Microbiology: Recent Results (from the past 240 hour(s))  SARS Coronavirus 2 Ag (30 min TAT) - Nasal Swab (BD Veritor Kit)     Status: None   Collection Time: 10/13/19 11:02 PM   Specimen: Nasal Swab (BD Veritor Kit)  Result Value Ref  Range Status   SARS Coronavirus 2 Ag NEGATIVE NEGATIVE Final    Comment: (NOTE) SARS-CoV-2 antigen NOT DETECTED.  Negative results are presumptive.  Negative results do not preclude SARS-CoV-2 infection and should not be used as the sole basis for treatment or other patient management decisions, including infection  control decisions, particularly in the presence of clinical signs and  symptoms consistent with COVID-19, or in those who have been in contact with the virus.  Negative results must be combined with clinical observations, patient history, and epidemiological information. The expected result is Negative. Fact Sheet for Patients: PodPark.tn Fact Sheet for Healthcare Providers: GiftContent.is This test is not yet approved or cleared by the [place] FDA and  has been authorized for detection and/or diagnosis of SARS-CoV-2 by FDA under an Emergency Use Authorization (EUA).  This EUA will remain in effect (meaning this test can be used) for the duration of  the COVID-19 de claration under Section 564(b)(1) of the Act, 21 U.S.C. section 360bbb-3(b)(1), unless the authorization is terminated or revoked sooner. Performed at [facility], [address] [place], [place] [address]   SARS CORONAVIRUS 2 (TAT 6-24 HRS) Nasopharyngeal Nasopharyngeal Swab     Status: None   Collection Time: 10/13/19 11:48 PM   Specimen: Nasopharyngeal Swab  Result Value Ref Range Status   SARS Coronavirus 2 NEGATIVE NEGATIVE Final    Comment: (NOTE) SARS-CoV-2 target nucleic acids are NOT DETECTED. The SARS-CoV-2 RNA is generally detectable in upper and lower respiratory specimens during the acute phase of infection. Negative results do not preclude SARS-CoV-2 infection, do not rule out co-infections with other pathogens, and should not be used as the sole basis for treatment or other patient management decisions. Negative  results must be combined with clinical observations, patient history, and epidemiological information. The expected result is Negative. Fact Sheet for Patients: SugarRoll.be Fact Sheet for Healthcare Providers: https://www.woods-mathews.com/ This test is not yet approved or cleared by the [place] FDA and  has been authorized for detection and/or diagnosis of SARS-CoV-2 by FDA under an Emergency Use Authorization (EUA). This EUA will remain  in effect (meaning this test can be used) for the duration of the COVID-19 declaration under Section 56 4(b)(1) of the Act, 21 U.S.C. section 360bbb-3(b)(1), unless the authorization is terminated or revoked sooner. Performed at [facility], [place] [address], [place], [place] [address]      Labs: BNP (last 3 results) No results for input(s): BNP in the last 8760 hours. Basic Metabolic Panel: Recent Labs  Lab 10/13/19 2237 10/14/19 0606 10/15/19 0538  NA 129* 134* 133*  K 3.4* 4.0 4.4  CL 95* 99 100  CO2 '23 27 25  '$ GLUCOSE 159* 112* 110*  BUN '13 11 9  '$ CREATININE 0.82 0.64 0.66  CALCIUM 10.1 9.5 9.5  MG  --  1.8  --    Liver Function Tests: No results for input(s): AST, ALT, ALKPHOS, BILITOT, PROT, ALBUMIN in the last 168 hours. No results for input(s): LIPASE, AMYLASE in the last 168 hours. No results for input(s): AMMONIA in the last 168 hours. CBC: Recent Labs  Lab 10/13/19 2237 10/14/19 0606 10/15/19 0538  WBC 22.2* 17.1* 13.8*  NEUTROABS 7.5 3.1 2.0  HGB 13.1 12.0 11.6*  HCT 39.9 35.5* 35.4*  MCV 90.5 90.8 91.9  PLT 411* 377 361   Cardiac Enzymes: No results for input(s): CKTOTAL, CKMB, CKMBINDEX, TROPONINI in the last 168 hours. BNP: Invalid input(s): POCBNP CBG: Recent Labs  Lab 10/13/19 2218  GLUCAP 137*   D-Dimer No results for input(s): DDIMER in the last 72 hours. Hgb A1c Recent Labs    10/15/19 0538  HGBA1C 5.8*   Lipid Profile Recent Labs     10/15/19 0538  CHOL 149  HDL 39*  LDLCALC 80  TRIG 152*  CHOLHDL 3.8   Thyroid function studies No results for input(s): TSH, T4TOTAL, T3FREE, THYROIDAB in the last 72 hours.  Invalid input(s): FREET3 Anemia work up No results for input(s): VITAMINB12, FOLATE, FERRITIN, TIBC, IRON, RETICCTPCT in the last 72 hours. Urinalysis    Component Value Date/Time   COLORURINE STRAW (A) 10/13/2019 2301   APPEARANCEUR CLEAR 10/13/2019 2301   LABSPEC <1.005 (L) 10/13/2019 2301   PHURINE 7.0 10/13/2019 2301   GLUCOSEU NEGATIVE 10/13/2019 2301   HGBUR TRACE (A) 10/13/2019 2301   BILIRUBINUR NEGATIVE 10/13/2019 2301   KETONESUR NEGATIVE 10/13/2019 2301   PROTEINUR 100 (A) 10/13/2019 2301   NITRITE NEGATIVE 10/13/2019 2301   LEUKOCYTESUR NEGATIVE 10/13/2019 2301   Sepsis Labs Invalid input(s): PROCALCITONIN,  WBC,  LACTICIDVEN  Microbiology Recent Results (from the past 240 hour(s))  SARS Coronavirus 2 Ag (30 min TAT) - Nasal Swab (BD Veritor Kit)     Status: None   Collection Time: 10/13/19 11:02 PM   Specimen: Nasal Swab (BD Veritor Kit)  Result Value Ref Range Status   SARS Coronavirus 2 Ag NEGATIVE NEGATIVE Final    Comment: (NOTE) SARS-CoV-2 antigen NOT DETECTED.  Negative results are presumptive.  Negative results do not preclude SARS-CoV-2 infection and should not be used as the sole basis for treatment or other patient management decisions, including infection  control decisions, particularly in the presence of clinical signs and  symptoms consistent with COVID-19, or in those who have been in contact with the virus.  Negative results must be combined with clinical observations, patient history, and epidemiological information. The expected result is Negative. Fact Sheet for Patients: PodPark.tn Fact Sheet for Healthcare Providers: GiftContent.is This test is not yet approved or cleared by the [place] FDA and   has been authorized for detection and/or diagnosis of SARS-CoV-2 by FDA under an Emergency Use Authorization (EUA).  This EUA will remain in effect (meaning this test can be used) for the duration of  the COVID-19 de claration under Section 564(b)(1) of the Act, 21 U.S.C. section 360bbb-3(b)(1), unless the authorization is terminated or revoked sooner. Performed at [facility], [address] [place], [place] [address]   SARS CORONAVIRUS 2 (TAT 6-24 HRS) Nasopharyngeal Nasopharyngeal Swab     Status: None   Collection Time: 10/13/19 11:48 PM   Specimen: Nasopharyngeal Swab  Result Value Ref Range Status   SARS Coronavirus 2 NEGATIVE NEGATIVE Final    Comment: (NOTE) SARS-CoV-2 target nucleic acids are NOT DETECTED. The SARS-CoV-2 RNA is generally detectable in upper and lower respiratory specimens during the acute phase of infection. Negative results do not preclude SARS-CoV-2 infection, do not rule out co-infections with other pathogens, and should not be used as the sole basis for treatment or other patient management decisions. Negative results must be combined with clinical observations, patient history, and epidemiological information. The expected result is Negative. Fact Sheet for Patients: SugarRoll.be Fact Sheet for Healthcare Providers: https://www.woods-mathews.com/ This test is not yet approved or cleared by the [place] FDA and  has been authorized for detection and/or diagnosis of SARS-CoV-2 by FDA under an Emergency Use Authorization (EUA). This EUA will remain  in effect (meaning this test can be used) for the duration of the COVID-19 declaration under Section 56 4(b)(1) of the Act, 21 U.S.C. section 360bbb-3(b)(1), unless the authorization is terminated or revoked sooner. Performed at [facility], [place] [address], [place], [place] [address]      Time coordinating discharge: Over 30  minutes  SIGNED:   Darliss Cheney, MD  [facility] 10/15/2019, 9:45 AM  If 7PM-7AM, please contact night-coverage [email or web address] Password [email or web address]

## 2019-10-15 NOTE — Discharge Instructions (Addendum)

## 2019-10-15 NOTE — Social Work (Signed)
CSW called and spoke with pt PCP office- their clinic has a specific post discharge clinic. An RN Navigator will call pt to schedule a f/u appointment. This information has been added to AVS.   CSW signing off. Please consult if any additional needs arise.  Alexander [place], [place] Work

## 2019-10-15 NOTE — Progress Notes (Signed)
Ginger SWOT RN, gave pt discharge instructions and pt stated understanding. TOC pharmacy to drop off prescriptions before discharge, IV has been removed pt dressed and ready all belongings packed.

## 2019-10-15 NOTE — Progress Notes (Signed)
Subjective:  Denies any chest pain or shortness of breath.   remains in sinus rhythm  Objective:  Vital Signs in the last 24 hours: Temp:  [97.9 F (36.6 C)-99.1 F (37.3 C)] 98.5 F (36.9 C) (12/21 0442) Pulse Rate:  [59-63] 61 (12/21 0442) Resp:  [17] 17 (12/21 0442) BP: (126-155)/(50-70) 155/70 (12/21 0442) SpO2:  [95 %-98 %] 95 % (12/21 0442)  Intake/Output from previous day: No intake/output data recorded. Intake/Output from this shift: No intake/output data recorded.  Physical Exam: Neck: no adenopathy, no carotid bruit, no JVD and supple, symmetrical, trachea midline Lungs: clear to auscultation bilaterally Heart: regular rate and rhythm, S1, S2 normal and soft systolic murmur noted Abdomen: soft, non-tender; bowel sounds normal; no masses,  no organomegaly Extremities: extremities normal, atraumatic, no cyanosis or edema  Lab Results: Recent Labs    10/14/19 0606 10/15/19 0538  WBC 17.1* 13.8*  HGB 12.0 11.6*  PLT 377 361   Recent Labs    10/14/19 0606 10/15/19 0538  NA 134* 133*  K 4.0 4.4  CL 99 100  CO2 27 25  GLUCOSE 112* 110*  BUN 11 9  CREATININE 0.64 0.66   No results for input(s): TROPONINI in the last 72 hours.  Invalid input(s): CK, MB Hepatic Function Panel No results for input(s): PROT, ALBUMIN, AST, ALT, ALKPHOS, BILITOT, BILIDIR, IBILI in the last 72 hours. Recent Labs    10/15/19 0538  CHOL 149   No results for input(s): PROTIME in the last 72 hours.  Imaging: Imaging results have been reviewed and CT Angio Head W or Wo Contrast  Result Date: 10/13/2019 CLINICAL DATA:  Initial evaluation for acute dizziness, syncope. EXAM: CT ANGIOGRAPHY HEAD AND NECK TECHNIQUE: Multidetector CT imaging of the head and neck was performed using the standard protocol during bolus administration of intravenous contrast. Multiplanar CT image reconstructions and MIPs were obtained to evaluate the vascular anatomy. Carotid stenosis measurements (when  applicable) are obtained utilizing NASCET criteria, using the distal internal carotid diameter as the denominator. CONTRAST:  158mL OMNIPAQUE IOHEXOL 350 MG/ML SOLN COMPARISON:  Prior head CT from earlier same day. FINDINGS: CTA NECK FINDINGS Aortic arch: Visualized aortic arch of normal caliber with normal branch pattern. Scattered atheromatous plaque noted about the arch itself. No hemodynamically significant stenosis seen about the origin the great vessels. Visualized subclavian arteries widely patent. Right carotid system: Right common carotid artery widely patent from its origin to the bifurcation without stenosis. Mild atheromatous irregularity about the right bifurcation without stenosis. Right ICA widely patent distally to the skull base without stenosis, dissection or occlusion. Left carotid system: Left CCA widely patent from its origin to the bifurcation without stenosis. Mild atheromatous irregularity about the left bifurcation without stenosis. Left ICA widely patent distally to the skull base without stenosis, dissection or occlusion. Vertebral arteries: Both vertebral arteries arise from subclavian arteries. Left vertebral artery slightly dominant, with a diffusely hypoplastic right vertebral artery. Focal plaque at the origin left vertebral artery with no more than mild stenosis. Vertebral arteries otherwise widely patent within the neck without stenosis dissection or occlusion. Skeleton: No acute osseous abnormality. No discrete osseous lesions. Moderate cervical spondylosis noted at C3-4 through C6-7. Other neck: No other acute soft tissue abnormality within the neck. No mass lesion or adenopathy. Few scattered subcentimeter hypodense thyroid nodules noted, for which no follow-up imaging recommended given size and patient age. Upper chest: Visualized upper chest demonstrates no acute finding. Review of the MIP images confirms the above findings  CTA HEAD FINDINGS Anterior circulation: Petrous  segments widely patent bilaterally. Mild atheromatous change within the carotid siphons without significant stenosis. A1 segments patent bilaterally. Normal anterior communicating artery complex. Anterior cerebral arteries widely patent or distal aspects without stenosis. No M1 stenosis or occlusion. Normal MCA bifurcations. Distal MCA branches well perfused and symmetric. Posterior circulation: Vertebral arteries widely patent to the vertebrobasilar junction without stenosis. Posterior inferior cerebral arteries patent bilaterally. Basilar widely patent to its distal aspect. Superior cerebral arteries patent bilaterally. Left PCA primarily supplied via the basilar. Fetal type origin of the right PCA. Both PCAs widely patent to their distal aspects. Venous sinuses: Patent. Anatomic variants: Fetal type origin right PCA. No intracranial aneurysm. Review of the MIP images confirms the above findings IMPRESSION: 1. Negative CTA for emergent large vessel occlusion. 2. Mild atheromatous change about the aortic arch, carotid bifurcations, and carotid siphons without hemodynamically significant or correctable stenosis. Electronically Signed   By: Jeannine Boga M.D.   On: 10/13/2019 23:22   CT Angio Neck W and/or Wo Contrast  Result Date: 10/13/2019 CLINICAL DATA:  Initial evaluation for acute dizziness, syncope. EXAM: CT ANGIOGRAPHY HEAD AND NECK TECHNIQUE: Multidetector CT imaging of the head and neck was performed using the standard protocol during bolus administration of intravenous contrast. Multiplanar CT image reconstructions and MIPs were obtained to evaluate the vascular anatomy. Carotid stenosis measurements (when applicable) are obtained utilizing NASCET criteria, using the distal internal carotid diameter as the denominator. CONTRAST:  171mL OMNIPAQUE IOHEXOL 350 MG/ML SOLN COMPARISON:  Prior head CT from earlier same day. FINDINGS: CTA NECK FINDINGS Aortic arch: Visualized aortic arch of normal  caliber with normal branch pattern. Scattered atheromatous plaque noted about the arch itself. No hemodynamically significant stenosis seen about the origin the great vessels. Visualized subclavian arteries widely patent. Right carotid system: Right common carotid artery widely patent from its origin to the bifurcation without stenosis. Mild atheromatous irregularity about the right bifurcation without stenosis. Right ICA widely patent distally to the skull base without stenosis, dissection or occlusion. Left carotid system: Left CCA widely patent from its origin to the bifurcation without stenosis. Mild atheromatous irregularity about the left bifurcation without stenosis. Left ICA widely patent distally to the skull base without stenosis, dissection or occlusion. Vertebral arteries: Both vertebral arteries arise from subclavian arteries. Left vertebral artery slightly dominant, with a diffusely hypoplastic right vertebral artery. Focal plaque at the origin left vertebral artery with no more than mild stenosis. Vertebral arteries otherwise widely patent within the neck without stenosis dissection or occlusion. Skeleton: No acute osseous abnormality. No discrete osseous lesions. Moderate cervical spondylosis noted at C3-4 through C6-7. Other neck: No other acute soft tissue abnormality within the neck. No mass lesion or adenopathy. Few scattered subcentimeter hypodense thyroid nodules noted, for which no follow-up imaging recommended given size and patient age. Upper chest: Visualized upper chest demonstrates no acute finding. Review of the MIP images confirms the above findings CTA HEAD FINDINGS Anterior circulation: Petrous segments widely patent bilaterally. Mild atheromatous change within the carotid siphons without significant stenosis. A1 segments patent bilaterally. Normal anterior communicating artery complex. Anterior cerebral arteries widely patent or distal aspects without stenosis. No M1 stenosis or  occlusion. Normal MCA bifurcations. Distal MCA branches well perfused and symmetric. Posterior circulation: Vertebral arteries widely patent to the vertebrobasilar junction without stenosis. Posterior inferior cerebral arteries patent bilaterally. Basilar widely patent to its distal aspect. Superior cerebral arteries patent bilaterally. Left PCA primarily supplied via the basilar. Fetal type origin  of the right PCA. Both PCAs widely patent to their distal aspects. Venous sinuses: Patent. Anatomic variants: Fetal type origin right PCA. No intracranial aneurysm. Review of the MIP images confirms the above findings IMPRESSION: 1. Negative CTA for emergent large vessel occlusion. 2. Mild atheromatous change about the aortic arch, carotid bifurcations, and carotid siphons without hemodynamically significant or correctable stenosis. Electronically Signed   By: Jeannine Boga M.D.   On: 10/13/2019 23:22   MR BRAIN WO CONTRAST  Result Date: 10/14/2019 CLINICAL DATA:  Initial evaluation for transient ischemic attack, sudden onset dizziness. EXAM: MRI HEAD WITHOUT CONTRAST TECHNIQUE: Multiplanar, multiecho pulse sequences of the brain and surrounding structures were obtained without intravenous contrast. COMPARISON:  Prior CT and CTA from 10/13/2019. FINDINGS: Brain: Mild age-related cerebral atrophy. Minimal patchy T2/FLAIR hyperintensity within the periventricular and deep white matter, most consistent with chronic small vessel ischemic disease, minimal for age. No abnormal foci of restricted diffusion to suggest acute or subacute ischemia. Gray-white matter differentiation maintained. No encephalomalacia to suggest chronic cortical infarction. No foci of susceptibility artifact to suggest acute or chronic intracranial hemorrhage. No mass lesion, midline shift or mass effect. No hydrocephalus. No extra-axial fluid collection. Pituitary gland suprasellar region normal. Midline structures intact. Vascular: Major  intracranial vascular flow voids are maintained. Skull and upper cervical spine: Craniocervical junction within normal limits. Degenerative disc osteophyte noted at C3-4 with resultant moderate spinal stenosis. Bone marrow signal intensity within normal limits. No scalp soft tissue abnormality. Sinuses/Orbits: Patient status post bilateral ocular lens replacement. Globes and orbital soft tissues demonstrate no acute finding. Paranasal sinuses are clear. Trace bilateral mastoid effusions, of doubtful significance. Other: None. IMPRESSION: 1. No acute intracranial abnormality. 2. Age-related cerebral atrophy with mild chronic small vessel ischemic disease. 3. Degenerative disc osteophyte at C3-4 with resultant moderate spinal stenosis. Finding could be further assessed with dedicated MRI of the cervical spine as clinically desired. Electronically Signed   By: Jeannine Boga M.D.   On: 10/14/2019 03:54   DG Chest Portable 1 View  Result Date: 10/13/2019 CLINICAL DATA:  Infectious workup. Sudden onset of dizziness leading to syncope. EXAM: PORTABLE CHEST 1 VIEW COMPARISON:  Lung apices from neck CTA earlier this day. FINDINGS: Heart is normal in size. Aortic tortuosity, otherwise normal mediastinal contours. Aortic atherosclerosis. Pulmonary vasculature is normal. No consolidation, pleural effusion, or pneumothorax. No acute osseous abnormalities are seen. IMPRESSION: 1. No acute abnormality.  No evidence of intrathoracic infection. 2.  Aortic Atherosclerosis (ICD10-I70.0). Electronically Signed   By: Keith Rake M.D.   On: 10/13/2019 23:36   ECHOCARDIOGRAM COMPLETE  Result Date: 10/14/2019   ECHOCARDIOGRAM REPORT   Patient Name:   Karen Bradford Date of Exam: 10/14/2019 Medical Rec #:  [id number]   Height:       66.0 in Accession #:    [id number]  Weight:       150.0 lb Date of Birth:  [birth year]   BSA:          1.77 m Patient Age:    31 years    BP:           136/72 mmHg Patient Gender: F            HR:           56 bpm. Exam Location:  Inpatient Procedure: 2D Echo, Cardiac Doppler and Color Doppler Indications:    I48.91* Unspeicified atrial fibrillation. TIA.  History:        Patient has no prior history of  Echocardiogram examinations.                 TIA, Arrythmias:Atrial Fibrillation; Signs/Symptoms:Syncope.  Sonographer:    Roseanna Rainbow RDCS Referring Phys: [id number] [place]  1. Left ventricular ejection fraction, by visual estimation, is 60 to 65%. The left ventricle has normal function. Left ventricular septal wall thickness was mildly increased. There is mildly increased left ventricular hypertrophy.  2. Left ventricular diastolic parameters are consistent with Grade II diastolic dysfunction (pseudonormalization).  3. The left ventricle has no regional wall motion abnormalities.  4. Global right ventricle has normal systolic function.The right ventricular size is normal. No increase in right ventricular wall thickness.  5. Left atrial size was normal.  6. Right atrial size was normal.  7. The mitral valve is normal in structure. Trivial mitral valve regurgitation.  8. The tricuspid valve is normal in structure. Tricuspid valve regurgitation is trivial.  9. The aortic valve is normal in structure. Aortic valve regurgitation is not visualized. 10. The pulmonic valve was grossly normal. Pulmonic valve regurgitation is not visualized. 11. Moderately elevated pulmonary artery systolic pressure. 12. The inferior vena cava is normal in size with greater than 50% respiratory variability, suggesting right atrial pressure of 3 mmHg. 13. The interatrial septum was not assessed. FINDINGS  Left Ventricle: Left ventricular ejection fraction, by visual estimation, is 60 to 65%. The left ventricle has normal function. The left ventricle has no regional wall motion abnormalities. There is mildly increased left ventricular hypertrophy. Left ventricular diastolic parameters are consistent with Grade II  diastolic dysfunction (pseudonormalization). Right Ventricle: The right ventricular size is normal. No increase in right ventricular wall thickness. Global RV systolic function is has normal systolic function. The tricuspid regurgitant velocity is 2.96 m/s, and with an assumed right atrial pressure  of 8 mmHg, the estimated right ventricular systolic pressure is moderately elevated at 43.0 mmHg. Left Atrium: Left atrial size was normal in size. Right Atrium: Right atrial size was normal in size Pericardium: There is no evidence of pericardial effusion. Mitral Valve: The mitral valve is normal in structure. Trivial mitral valve regurgitation. MV peak gradient, 6.2 mmHg. Tricuspid Valve: The tricuspid valve is normal in structure. Tricuspid valve regurgitation is trivial. Aortic Valve: The aortic valve is normal in structure. Aortic valve regurgitation is not visualized. Pulmonic Valve: The pulmonic valve was grossly normal. Pulmonic valve regurgitation is not visualized. Pulmonic regurgitation is not visualized. Aorta: The aortic root is normal in size and structure. Venous: The inferior vena cava is normal in size with greater than 50% respiratory variability, suggesting right atrial pressure of 3 mmHg. IAS/Shunts: The interatrial septum was not assessed.  LEFT VENTRICLE PLAX 2D LVIDd:         3.60 cm       Diastology LVIDs:         2.30 cm       LV e' lateral:   5.08 cm/s LV PW:         1.50 cm       LV E/e' lateral: 25.6 LV IVS:        1.30 cm       LV e' medial:    6.18 cm/s LVOT diam:     1.90 cm       LV E/e' medial:  21.0 LV SV:         36 ml LV SV Index:   20.31 LVOT Area:     2.84 cm  LV Volumes (MOD) LV  area d, A2C:    20.70 cm LV area d, A4C:    22.00 cm LV area s, A2C:    12.00 cm LV area s, A4C:    12.30 cm LV major d, A2C:   6.62 cm LV major d, A4C:   6.78 cm LV major s, A2C:   5.61 cm LV major s, A4C:   5.76 cm LV vol d, MOD A2C: 53.1 ml LV vol d, MOD A4C: 58.6 ml LV vol s, MOD A2C: 21.2 ml LV vol  s, MOD A4C: 22.6 ml LV SV MOD A2C:     31.9 ml LV SV MOD A4C:     58.6 ml LV SV MOD BP:      34.2 ml RIGHT VENTRICLE            IVC RV S prime:     9.59 cm/s  IVC diam: 2.50 cm TAPSE (M-mode): 1.8 cm LEFT ATRIUM             Index       RIGHT ATRIUM           Index LA diam:        3.40 cm 1.92 cm/m  RA Area:     15.10 cm LA Vol (A2C):   38.4 ml 21.70 ml/m RA Volume:   34.20 ml  19.33 ml/m LA Vol (A4C):   43.8 ml 24.75 ml/m LA Biplane Vol: 43.6 ml 24.64 ml/m  AORTIC VALVE LVOT Vmax:   73.80 cm/s LVOT Vmean:  46.000 cm/s LVOT VTI:    0.140 m  AORTA Ao Root diam: 2.80 cm Ao Asc diam:  3.30 cm MITRAL VALVE                         TRICUSPID VALVE MV Area (PHT): 3.21 cm              TR Peak grad:   35.0 mmHg MV Peak grad:  6.2 mmHg              TR Vmax:        296.00 cm/s MV Mean grad:  2.0 mmHg MV Vmax:       1.25 m/s              SHUNTS MV Vmean:      68.8 cm/s             Systemic VTI:  0.14 m MV VTI:        0.42 m                Systemic Diam: 1.90 cm MV PHT:        68.44 msec MV Decel Time: 236 msec MV E velocity: 130.00 cm/s 103 cm/s MV A velocity: 40.40 cm/s  70.3 cm/s MV E/A ratio:  3.22        1.5  Charolette Forward MD Electronically signed by Charolette Forward MD Signature Date/Time: 10/14/2019/10:42:41 AM    Final    CT HEAD CODE STROKE WO CONTRAST  Result Date: 10/13/2019 CLINICAL DATA:  Code stroke. Initial evaluation for acute dizziness, syncope. EXAM: CT HEAD WITHOUT CONTRAST TECHNIQUE: Contiguous axial images were obtained from the base of the skull through the vertex without intravenous contrast. COMPARISON:  Prior head CT from 09/10/2005. FINDINGS: Brain: Generalized age-related cerebral atrophy with mild chronic small vessel ischemic disease. No acute intracranial hemorrhage. No acute large vessel territory infarct. No mass lesion, midline shift or mass effect. No  hydrocephalus. No extra-axial fluid collection. Small dilated perivascular space noted at the inferior right basal ganglia. Vascular: No  hyperdense vessel. Skull: Scalp soft tissues and calvarium within normal limits. Sinuses/Orbits: Globes orbital soft tissues normal. Paranasal sinuses mastoid air cells are clear. Other: None. ASPECTS [facility] Stroke Program Early CT Score) - Ganglionic level infarction (caudate, lentiform nuclei, internal capsule, insula, M1-M3 cortex): 7 - Supraganglionic infarction (M4-M6 cortex): 3 Total score (0-10 with 10 being normal): 10 IMPRESSION: 1. No acute intracranial infarct or other abnormality identified. 2. ASPECTS is 10. 3. Age-related cerebral atrophy with mild chronic small vessel ischemic disease. Critical Value/emergent results were called by telephone at the time of interpretation on 10/13/2019 at 10:42 pm to providerMATTHEW TRIFAN , who verbally acknowledged these results. Electronically Signed   By: Jeannine Boga M.D.   On: 10/13/2019 22:43    Cardiac Studies:  Assessment/Plan:  New onset status post paroxysmal A. fib with controlled ventricular response chads vas score of 5 Status post TIA probably cardioembolic Hypertension Chronic lymphoid leukemia GERD Depression History of breast carcinoma Peripheral neuropathy Hyperlipidemia Plan Continue present management. Okay to discharge from cardiac point of view Follow-up with me in one to 2 weeks  LOS: 1 day    Charolette Forward 10/15/2019, 9:42 AM

## 2019-10-15 NOTE — Care Management (Signed)
Entered benefits check for Eliquis. Patient can receive first 30 days free from [facility].   Magdalen Spatz RN

## 2020-05-31 IMAGING — CT CT ANGIO NECK
1 of 8 series · 6 of 33 positions shown · IV contrast (omnipaque)
Comparison: Prior head CT from earlier same day.

CLINICAL DATA: Initial evaluation for acute dizziness, syncope.

EXAM:
CT ANGIOGRAPHY HEAD AND NECK
TECHNIQUE: Multidetector CT imaging of the head and neck was performed using
the standard protocol during bolus administration of intravenous
contrast. Multiplanar CT image reconstructions and MIPs were
obtained to evaluate the vascular anatomy. Carotid stenosis
measurements (when applicable) are obtained utilizing NASCET
criteria, using the distal internal carotid diameter as the
denominator.
CONTRAST:  100mL OMNIPAQUE IOHEXOL 350 MG/ML SOLN

[Series 6: axial thin · axial · 0.57mm/px · z∈[+794,+1046]mm · 6 of 354 slices shown]
[im 51/354  soft-tissue]
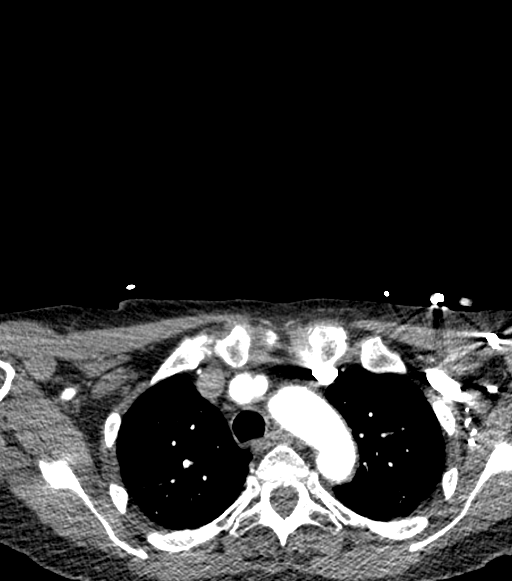
[im 101/354  bone]
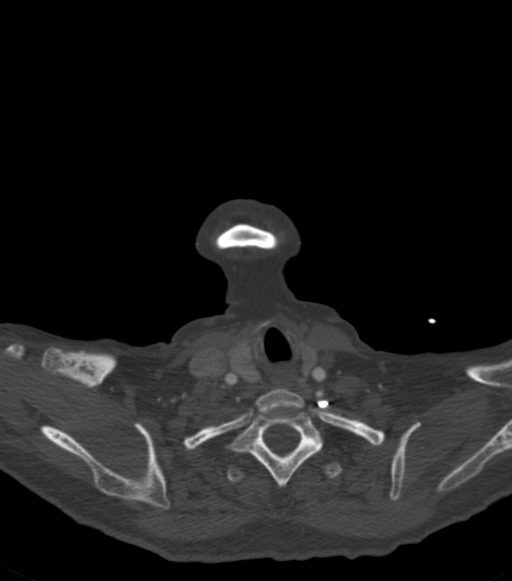
[im 152/354  soft-tissue]
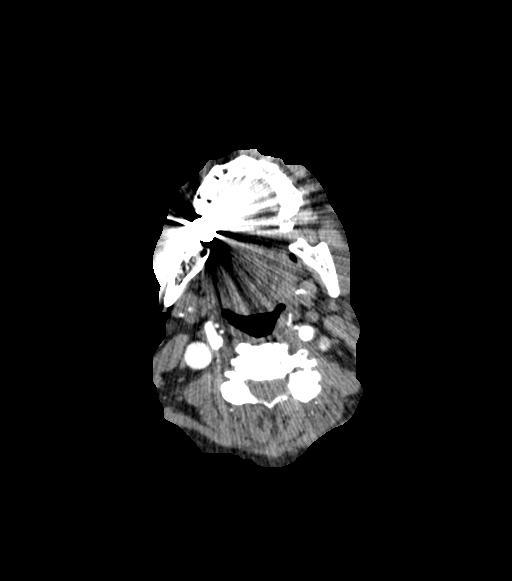
[im 202/354  bone]
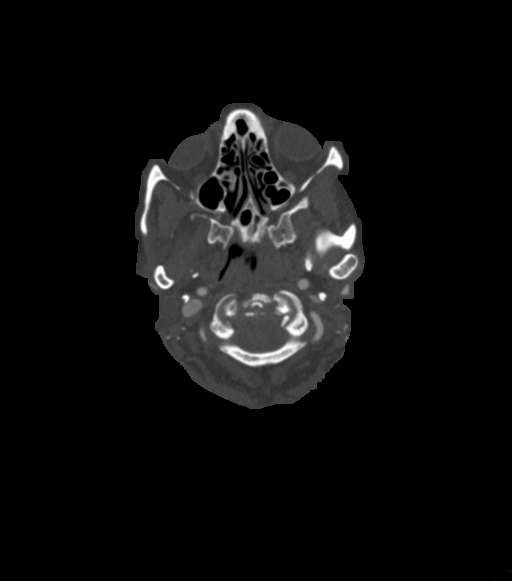
[im 253/354  soft-tissue]
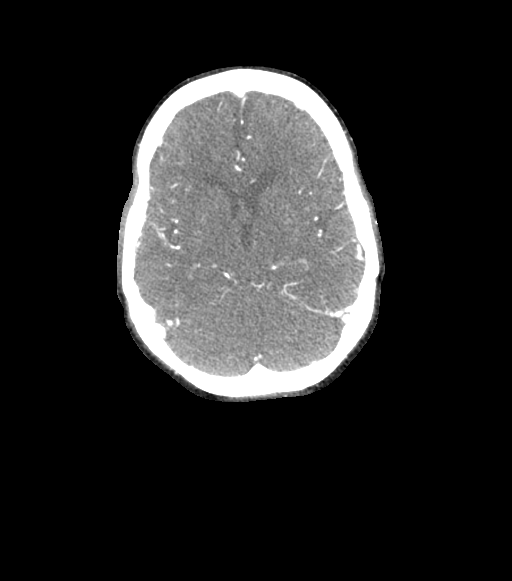
[im 303/354  bone]
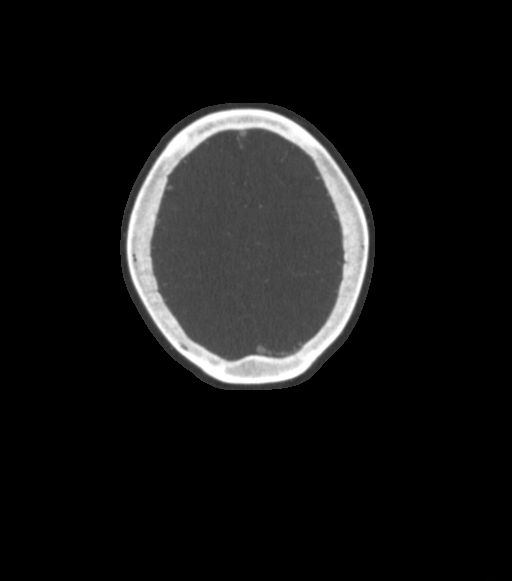

[6 of 33 positions shown; findings below may reference images not displayed]

FINDINGS: CTA NECK FINDINGS

Aortic arch: Visualized aortic arch of normal caliber with normal
branch pattern. Scattered atheromatous plaque noted about the arch
itself. No hemodynamically significant stenosis seen about the
origin the great vessels. Visualized subclavian arteries widely
patent.

Right carotid system: Right common carotid artery widely patent from
its origin to the bifurcation without stenosis. Mild atheromatous
irregularity about the right bifurcation without stenosis. Right ICA
widely patent distally to the skull base without stenosis,
dissection or occlusion.

Left carotid system: Left CCA widely patent from its origin to the
bifurcation without stenosis. Mild atheromatous irregularity about
the left bifurcation without stenosis. Left ICA widely patent
distally to the skull base without stenosis, dissection or
occlusion.

Vertebral arteries: Both vertebral arteries arise from subclavian
arteries. Left vertebral artery slightly dominant, with a diffusely
hypoplastic right vertebral artery. Focal plaque at the origin left
vertebral artery with no more than mild stenosis. Vertebral arteries
otherwise widely patent within the neck without stenosis dissection
or occlusion.

Skeleton: No acute osseous abnormality. No discrete osseous lesions.
Moderate cervical spondylosis noted at C3-4 through C6-7.

Other neck: No other acute soft tissue abnormality within the neck.
No mass lesion or adenopathy. Few scattered subcentimeter hypodense
thyroid nodules noted, for which no follow-up imaging recommended
given size and patient age.

Upper chest: Visualized upper chest demonstrates no acute finding.

Review of the MIP images confirms the above findings

CTA HEAD FINDINGS

Anterior circulation: Petrous segments widely patent bilaterally.
Mild atheromatous change within the carotid siphons without
significant stenosis. A1 segments patent bilaterally. Normal
anterior communicating artery complex. Anterior cerebral arteries
widely patent or distal aspects without stenosis. No M1 stenosis or
occlusion. Normal MCA bifurcations. Distal MCA branches well
perfused and symmetric.

Posterior circulation: Vertebral arteries widely patent to the
vertebrobasilar junction without stenosis. Posterior inferior
cerebral arteries patent bilaterally. Basilar widely patent to its
distal aspect. Superior cerebral arteries patent bilaterally. Left
PCA primarily supplied via the basilar. Fetal type origin of the
right PCA. Both PCAs widely patent to their distal aspects.

Venous sinuses: Patent.

Anatomic variants: Fetal type origin right PCA. No intracranial
aneurysm.

Review of the MIP images confirms the above findings
IMPRESSION: 1. Negative CTA for emergent large vessel occlusion.
2. Mild atheromatous change about the aortic arch, carotid
bifurcations, and carotid siphons without hemodynamically
significant or correctable stenosis.

## 2022-06-17 ENCOUNTER — Observation Stay (HOSPITAL_COMMUNITY): Payer: Medicare Other

## 2022-06-17 ENCOUNTER — Emergency Department (HOSPITAL_COMMUNITY): Payer: Medicare Other

## 2022-06-17 ENCOUNTER — Encounter (HOSPITAL_COMMUNITY): Payer: Self-pay

## 2022-06-17 ENCOUNTER — Inpatient Hospital Stay (HOSPITAL_COMMUNITY)
Admission: EM | Admit: 2022-06-17 | Discharge: 2022-06-21 | DRG: 069 | Disposition: A | Discharge status: Home Health | Payer: Medicare Other | Attending: Internal Medicine | Admitting: Internal Medicine

## 2022-06-17 ENCOUNTER — Other Ambulatory Visit: Payer: Self-pay

## 2022-06-17 DIAGNOSIS — Z808 Family history of malignant neoplasm of other organs or systems: Secondary | ICD-10-CM

## 2022-06-17 DIAGNOSIS — E860 Dehydration: Secondary | ICD-10-CM | POA: Diagnosis present

## 2022-06-17 DIAGNOSIS — R42 Dizziness and giddiness: Secondary | ICD-10-CM

## 2022-06-17 DIAGNOSIS — I4891 Unspecified atrial fibrillation: Secondary | ICD-10-CM | POA: Diagnosis present

## 2022-06-17 DIAGNOSIS — M797 Fibromyalgia: Secondary | ICD-10-CM | POA: Diagnosis present

## 2022-06-17 DIAGNOSIS — Z79899 Other long term (current) drug therapy: Secondary | ICD-10-CM

## 2022-06-17 DIAGNOSIS — Z853 Personal history of malignant neoplasm of breast: Secondary | ICD-10-CM

## 2022-06-17 DIAGNOSIS — I10 Essential (primary) hypertension: Secondary | ICD-10-CM

## 2022-06-17 DIAGNOSIS — B348 Other viral infections of unspecified site: Secondary | ICD-10-CM

## 2022-06-17 DIAGNOSIS — B9789 Other viral agents as the cause of diseases classified elsewhere: Secondary | ICD-10-CM | POA: Diagnosis present

## 2022-06-17 DIAGNOSIS — J069 Acute upper respiratory infection, unspecified: Secondary | ICD-10-CM | POA: Diagnosis present

## 2022-06-17 DIAGNOSIS — Z8249 Family history of ischemic heart disease and other diseases of the circulatory system: Secondary | ICD-10-CM

## 2022-06-17 DIAGNOSIS — E871 Hypo-osmolality and hyponatremia: Secondary | ICD-10-CM | POA: Diagnosis present

## 2022-06-17 DIAGNOSIS — K219 Gastro-esophageal reflux disease without esophagitis: Secondary | ICD-10-CM | POA: Diagnosis present

## 2022-06-17 DIAGNOSIS — R29810 Facial weakness: Secondary | ICD-10-CM | POA: Diagnosis present

## 2022-06-17 DIAGNOSIS — G459 Transient cerebral ischemic attack, unspecified: Secondary | ICD-10-CM | POA: Diagnosis not present

## 2022-06-17 DIAGNOSIS — Z7901 Long term (current) use of anticoagulants: Secondary | ICD-10-CM

## 2022-06-17 DIAGNOSIS — K5900 Constipation, unspecified: Secondary | ICD-10-CM

## 2022-06-17 DIAGNOSIS — F419 Anxiety disorder, unspecified: Secondary | ICD-10-CM | POA: Diagnosis present

## 2022-06-17 DIAGNOSIS — E876 Hypokalemia: Secondary | ICD-10-CM

## 2022-06-17 DIAGNOSIS — R350 Frequency of micturition: Secondary | ICD-10-CM | POA: Diagnosis present

## 2022-06-17 DIAGNOSIS — Z9011 Acquired absence of right breast and nipple: Secondary | ICD-10-CM

## 2022-06-17 DIAGNOSIS — C9110 Chronic lymphocytic leukemia of B-cell type not having achieved remission: Secondary | ICD-10-CM | POA: Diagnosis present

## 2022-06-17 DIAGNOSIS — F32A Depression, unspecified: Secondary | ICD-10-CM | POA: Diagnosis present

## 2022-06-17 DIAGNOSIS — Z87891 Personal history of nicotine dependence: Secondary | ICD-10-CM

## 2022-06-17 DIAGNOSIS — G4700 Insomnia, unspecified: Secondary | ICD-10-CM | POA: Diagnosis present

## 2022-06-17 DIAGNOSIS — Z20822 Contact with and (suspected) exposure to covid-19: Secondary | ICD-10-CM | POA: Diagnosis present

## 2022-06-17 LAB — COMPREHENSIVE METABOLIC PANEL
ALT: 21 U/L (ref 0–44)
AST: 21 U/L (ref 15–41)
Albumin: 3.9 g/dL (ref 3.5–5.0)
Alkaline Phosphatase: 78 U/L (ref 38–126)
Anion gap: 9 (ref 5–15)
BUN: 6 mg/dL — ABNORMAL LOW (ref 8–23)
CO2: 26 mmol/L (ref 22–32)
Calcium: 10.3 mg/dL (ref 8.9–10.3)
Chloride: 94 mmol/L — ABNORMAL LOW (ref 98–111)
Creatinine, Ser: 0.64 mg/dL (ref 0.44–1.00)
GFR, Estimated: >60 mL/min (ref >60)
Glucose, Bld: 133 mg/dL — ABNORMAL HIGH (ref 70–99)
Potassium: 3.9 mmol/L (ref 3.5–5.1)
Sodium: 129 mmol/L — ABNORMAL LOW (ref 135–145)
Total Bilirubin: 0.5 mg/dL (ref 0.3–1.2)
Total Protein: 6.7 g/dL (ref 6.5–8.1)

## 2022-06-17 LAB — LIPID PANEL
Cholesterol: 123 mg/dL (ref 0–200)
HDL: 60 mg/dL (ref >40)
LDL Cholesterol: 49 mg/dL (ref 0–99)
Total CHOL/HDL Ratio: 2.1 RATIO
Triglycerides: 69 mg/dL (ref <150)
VLDL: 14 mg/dL (ref 0–40)

## 2022-06-17 LAB — CBC WITH DIFFERENTIAL/PLATELET
Abs Immature Granulocytes: 0.04 10*3/uL (ref 0.00–0.07)
Basophils Absolute: 0.1 10*3/uL (ref 0.0–0.1)
Basophils Relative: 0 %
Eosinophils Absolute: 0.1 10*3/uL (ref 0.0–0.5)
Eosinophils Relative: 0 %
HCT: 40.9 % (ref 36.0–46.0)
Hemoglobin: 13.7 g/dL (ref 12.0–15.0)
Immature Granulocytes: 0 %
Lymphocytes Relative: 89 %
Lymphs Abs: 29.8 10*3/uL — ABNORMAL HIGH (ref 0.7–4.0)
MCH: 30.7 pg (ref 26.0–34.0)
MCHC: 33.5 g/dL (ref 30.0–36.0)
MCV: 91.7 fL (ref 80.0–100.0)
Monocytes Absolute: 0.3 10*3/uL (ref 0.1–1.0)
Monocytes Relative: 1 %
Neutro Abs: 3.2 10*3/uL (ref 1.7–7.7)
Neutrophils Relative %: 10 %
Platelets: 378 10*3/uL (ref 150–400)
RBC: 4.46 MIL/uL (ref 3.87–5.11)
RDW: 14.6 % (ref 11.5–15.5)
WBC: 33.5 10*3/uL — ABNORMAL HIGH (ref 4.0–10.5)
nRBC: 0 % (ref 0.0–0.2)

## 2022-06-17 LAB — URINALYSIS, ROUTINE W REFLEX MICROSCOPIC
Bilirubin Urine: NEGATIVE
Glucose, UA: NEGATIVE mg/dL
Ketones, ur: NEGATIVE mg/dL
Leukocytes,Ua: NEGATIVE
Nitrite: NEGATIVE
Protein, ur: NEGATIVE mg/dL
Specific Gravity, Urine: 1.01 (ref 1.005–1.030)
pH: 7 (ref 5.0–8.0)

## 2022-06-17 LAB — SARS CORONAVIRUS 2 BY RT PCR: SARS Coronavirus 2 by RT PCR: NEGATIVE

## 2022-06-17 LAB — PATHOLOGIST SMEAR REVIEW

## 2022-06-17 MED ORDER — BENZONATATE 100 MG PO CAPS
100.0000 mg | ORAL_CAPSULE | Freq: Three times a day (TID) | ORAL | Status: DC | PRN
Start: 2022-06-17 — End: 2022-06-21

## 2022-06-17 MED ORDER — LOSARTAN POTASSIUM 50 MG PO TABS
100.0000 mg | ORAL_TABLET | Freq: Once | ORAL | Status: DC
  Filled 2022-06-17: qty 2

## 2022-06-17 MED ORDER — DULOXETINE HCL 30 MG PO CPEP
60.0000 mg | ORAL_CAPSULE | Freq: Every day | ORAL | Status: DC
  Administered 2022-06-18 – 2022-06-21 (×4): 60 mg via ORAL
  Filled 2022-06-17 (×4): qty 2

## 2022-06-17 MED ORDER — BRIMONIDINE TARTRATE 0.2 % OP SOLN
1.0000 [drp] | Freq: Every day | OPHTHALMIC | Status: DC
Start: 2022-06-18 — End: 2022-06-21
  Administered 2022-06-18 – 2022-06-21 (×4): 1 [drp] via OPHTHALMIC
  Filled 2022-06-17: qty 5

## 2022-06-17 MED ORDER — IOHEXOL 350 MG/ML SOLN
75.0000 mL | Freq: Once | INTRAVENOUS | Status: AC | PRN
  Administered 2022-06-17: 75 mL via INTRAVENOUS

## 2022-06-17 MED ORDER — PANTOPRAZOLE SODIUM 40 MG PO TBEC
40.0000 mg | DELAYED_RELEASE_TABLET | Freq: Every day | ORAL | Status: DC
  Administered 2022-06-18 – 2022-06-21 (×4): 40 mg via ORAL
  Filled 2022-06-17 (×4): qty 1

## 2022-06-17 MED ORDER — AMIODARONE HCL 200 MG PO TABS
100.0000 mg | ORAL_TABLET | Freq: Every day | ORAL | Status: DC
  Administered 2022-06-18 – 2022-06-21 (×4): 100 mg via ORAL
  Filled 2022-06-17 (×4): qty 1

## 2022-06-17 MED ORDER — MIRABEGRON ER 25 MG PO TB24
25.0000 mg | ORAL_TABLET | Freq: Every morning | ORAL | Status: DC
  Administered 2022-06-18 – 2022-06-21 (×4): 25 mg via ORAL
  Filled 2022-06-17 (×5): qty 1

## 2022-06-17 MED ORDER — GUAIFENESIN ER 600 MG PO TB12
600.0000 mg | ORAL_TABLET | Freq: Two times a day (BID) | ORAL | Status: DC
Start: 2022-06-17 — End: 2022-06-21
  Administered 2022-06-17 – 2022-06-21 (×8): 600 mg via ORAL
  Filled 2022-06-17 (×8): qty 1

## 2022-06-17 MED ORDER — ZOLPIDEM TARTRATE 5 MG PO TABS
2.5000 mg | ORAL_TABLET | Freq: Every day | ORAL | Status: DC
  Administered 2022-06-17 – 2022-06-20 (×4): 2.5 mg via ORAL
  Filled 2022-06-17 (×4): qty 1

## 2022-06-17 MED ORDER — ATORVASTATIN CALCIUM 40 MG PO TABS
40.0000 mg | ORAL_TABLET | Freq: Every day | ORAL | Status: DC
  Administered 2022-06-18 – 2022-06-21 (×4): 40 mg via ORAL
  Filled 2022-06-17 (×4): qty 1

## 2022-06-17 MED ORDER — HYDRALAZINE HCL 20 MG/ML IJ SOLN
5.0000 mg | Freq: Once | INTRAMUSCULAR | Status: DC
Start: 2022-06-17 — End: 2022-06-17
  Filled 2022-06-17: qty 1

## 2022-06-17 MED ORDER — MENTHOL 3 MG MT LOZG
1.0000 | LOZENGE | OROMUCOSAL | Status: DC | PRN
  Filled 2022-06-17: qty 9

## 2022-06-17 MED ORDER — ASPIRIN 325 MG PO TABS
325.0000 mg | ORAL_TABLET | Freq: Once | ORAL | Status: AC
  Administered 2022-06-17: 325 mg via ORAL

## 2022-06-17 MED ORDER — ACETAMINOPHEN 325 MG PO TABS
650.0000 mg | ORAL_TABLET | Freq: Four times a day (QID) | ORAL | Status: DC | PRN
  Administered 2022-06-17 – 2022-06-20 (×6): 650 mg via ORAL
  Filled 2022-06-17 (×6): qty 2

## 2022-06-17 MED ORDER — ARIPIPRAZOLE 2 MG PO TABS
2.0000 mg | ORAL_TABLET | Freq: Every day | ORAL | Status: DC
  Filled 2022-06-17: qty 1

## 2022-06-17 MED ORDER — SODIUM CHLORIDE 0.9 % IV BOLUS
500.0000 mL | Freq: Once | INTRAVENOUS | Status: AC
  Administered 2022-06-17: 500 mL via INTRAVENOUS

## 2022-06-17 MED ORDER — PREGABALIN 25 MG PO CAPS
50.0000 mg | ORAL_CAPSULE | Freq: Two times a day (BID) | ORAL | Status: DC
  Administered 2022-06-17 – 2022-06-21 (×8): 50 mg via ORAL
  Filled 2022-06-17 (×8): qty 2

## 2022-06-17 MED ORDER — LATANOPROST 0.005 % OP SOLN
1.0000 [drp] | Freq: Every day | OPHTHALMIC | Status: DC
  Administered 2022-06-17 – 2022-06-20 (×4): 1 [drp] via OPHTHALMIC
  Filled 2022-06-17: qty 2.5

## 2022-06-17 MED ORDER — APIXABAN 5 MG PO TABS
5.0000 mg | ORAL_TABLET | Freq: Two times a day (BID) | ORAL | Status: DC
  Administered 2022-06-17 – 2022-06-21 (×8): 5 mg via ORAL
  Filled 2022-06-17 (×8): qty 1

## 2022-06-17 MED ORDER — ACETAMINOPHEN 650 MG RE SUPP: 650.0000 mg | Freq: Four times a day (QID) | RECTAL | Status: DC | PRN

## 2022-06-17 MED ORDER — TIMOLOL MALEATE 0.5 % OP SOLN
1.0000 [drp] | Freq: Every day | OPHTHALMIC | Status: DC
Start: 2022-06-18 — End: 2022-06-21
  Administered 2022-06-18 – 2022-06-21 (×4): 1 [drp] via OPHTHALMIC
  Filled 2022-06-17: qty 5

## 2022-06-17 NOTE — Plan of Care (Signed)
Was called by the EDP regarding recommendations on imaging.  Sudden onset facial droop in a patient with A-fib on Eliquis.  Recommended getting an MRI which is negative for acute process.  C-spine abnormalities are noted-which should be followed as recommended by radiology. In terms of a stroke work-up, she is adequately treated with anticoagulation and unless there is pressing evidence that there was a definitive stroke or TIA, I do not see a role for pursuing further inpatient neurological work-up. Please feel free to call neurology with questions as needed  -- Amie Portland, MD Neurologist [facility] Pager: [phone]

## 2022-06-17 NOTE — ED Notes (Signed)
Pt transported to MRI 

## 2022-06-17 NOTE — ED Notes (Signed)
Provider at bedside at this time after being messaged by this RN to speak with the patient regarding her care plan at [facility] and patient reports that she wants to go to [place]

## 2022-06-17 NOTE — ED Notes (Signed)
CT called at this time to place patient on list for new CT scan patient updated on the plan of care and verbalizes understanding of such. Patient reports no complaints at this time

## 2022-06-17 NOTE — ED Triage Notes (Signed)
Patient bib [facility] from home with complaints of high B/P with dizziness and headache. Patient is currently taking antibiotics for upper respiratory infection. Patient has not taken B/P med today or blood thinners. Patient has left sided facial droop.

## 2022-06-17 NOTE — Progress Notes (Signed)
Pt arrived to unit via transport. Ambulated from stretcher to bed. Bed placed in lowest position, bed alarms in place, and call bell/ personal belongings with in reach.

## 2022-06-17 NOTE — ED Provider Notes (Signed)
Karen Bradford   CSN: [id number] Arrival date & time: 06/17/22  1025     History  Chief Complaint  Patient presents with   Facial Droop   Dizziness    Karen Bradford is a 76 y.o. female PMH A-fib on Eliquis, TIA, syncope, chronic lymphocytic leukemia who is presenting with dizziness and facial droop.  Patient brought in by EMS, contributed history.  They report that the patient called them earlier today for elevated blood pressures and dizziness that initially began yesterday at about 5 PM.  Patient also notes headache that started at that time.  Patient denies any fevers, chest pain, difficulty breathing.  Notes some nausea, but denies vomiting.  Denies any weakness.   Home Medications Prior to Admission medications   Medication Sig Start Date End Date Taking? Authorizing Provider  acyclovir (ZOVIRAX) 400 MG tablet Take 400 mg by mouth as needed (rosacea).     [provider]  alclomethasone (ACLOVATE) 0.05 % cream Apply 1 application topically daily.    [provider]  amiodarone (PACERONE) 100 MG tablet Take 1 tablet (100 mg total) by mouth daily. 10/16/19 11/15/19  Darliss Cheney, MD  apixaban (ELIQUIS) 5 MG TABS tablet Take 1 tablet (5 mg total) by mouth 2 (two) times daily. 10/15/19 11/14/19  Darliss Cheney, MD  ARIPiprazole (ABILIFY) 5 MG tablet Take 5 mg by mouth daily.     [provider]  atorvastatin (LIPITOR) 40 MG tablet Take 1 tablet (40 mg total) by mouth daily. 10/15/19 11/14/19  Darliss Cheney, MD  brimonidine (ALPHAGAN) 0.2 % ophthalmic solution Place 1 drop into both eyes daily. 10/09/19   [provider]  cholecalciferol (VITAMIN D) 1000 units tablet Take 1,000 Units by mouth daily.    [provider]  clonazePAM (KLONOPIN) 1 MG tablet Take 1 mg by mouth every evening.     [provider]  dorzolamide-timolol (COSOPT) 22.3-6.8 MG/ML ophthalmic solution Place 1 drop into  both eyes daily.     [provider]  losartan (COZAAR) 100 MG tablet Take 100 mg by mouth daily.    [provider]  magnesium oxide (MAG-OX) 400 MG tablet Take 400 mg by mouth every morning.     [provider]  omeprazole (PRILOSEC) 40 MG capsule Take 40 mg by mouth every morning.     [provider]  oxybutynin (DITROPAN) 5 MG tablet Take 5 mg by mouth every morning.     [provider]  QUEtiapine (SEROQUEL) 100 MG tablet Take 50 mg by mouth at bedtime.     [provider]  sertraline (ZOLOFT) 100 MG tablet Take 100 mg by mouth daily.     [provider]  timolol (TIMOPTIC) 0.5 % ophthalmic solution Place 1 drop into both eyes daily.     [provider]  zolpidem (AMBIEN) 5 MG tablet Take 2.5 mg by mouth at bedtime.     [provider]      Allergies    Colesevelam    Review of Systems   Review of Systems  Neurological:  Positive for dizziness.    Physical Exam Updated Vital Signs There were no vitals taken for this visit. Physical Exam Vitals and nursing Bradford reviewed.  Constitutional:      General: She is not in acute distress.    Appearance: Normal appearance. She is well-developed. She is not ill-appearing or diaphoretic.  HENT:     Head: Normocephalic and atraumatic.  Right Ear: External ear normal.     Left Ear: External ear normal.     Nose: Nose normal.     Mouth/Throat:     Mouth: Mucous membranes are moist.  Eyes:     Comments: Left-sided facial droop.  Cardiovascular:     Rate and Rhythm: Normal rate and regular rhythm.     Heart sounds: No murmur heard. Pulmonary:     Effort: Pulmonary effort is normal. No respiratory distress.     Breath sounds: Normal breath sounds.  Abdominal:     General: There is no distension.     Palpations: Abdomen is soft.     Tenderness: There is no abdominal tenderness. There is no guarding.  Musculoskeletal:     Cervical back: Neck supple.      Right lower leg: No edema.     Left lower leg: No edema.  Skin:    General: Skin is warm and dry.  Neurological:     Mental Status: She is alert.     Comments: Patient is able to lift all extremities up off the bed and hold them.  Sensation grossly intact.  Heel-to-shin testing normal.     ED Results / Procedures / Treatments   Labs (all labs ordered are listed, but only abnormal results are displayed) Labs Reviewed  CBC WITH DIFFERENTIAL/PLATELET  COMPREHENSIVE METABOLIC PANEL  CBG MONITORING, ED    EKG None  Radiology DG Chest Portable 1 View  Result Date: 06/17/2022 CLINICAL DATA:  Leukocytosis EXAM: PORTABLE CHEST 1 VIEW COMPARISON:  09/25/2021 FINDINGS: The heart size and mediastinal contours are within normal limits. Aortic atherosclerosis. No focal airspace consolidation, pleural effusion, or pneumothorax. The visualized skeletal structures are unremarkable. IMPRESSION: No active disease. Electronically Signed   By: Davina Poke D.O.   On: 06/17/2022 12:45   CT Head Wo Contrast  Result Date: 06/17/2022 CLINICAL DATA:  Neuro deficit, acute, stroke suspected EXAM: CT HEAD WITHOUT CONTRAST TECHNIQUE: Contiguous axial images were obtained from the base of the skull through the vertex without intravenous contrast. RADIATION DOSE REDUCTION: This exam was performed according to the departmental dose-optimization program which includes automated exposure control, adjustment of the mA and/or kV according to patient size and/or use of iterative reconstruction technique. COMPARISON:  2020 FINDINGS: Brain: There is no acute intracranial hemorrhage, mass effect, or edema. Gray-white differentiation is preserved. There is no extra-axial fluid collection. Ventricles and sulci are stable in size and configuration. Patchy hypoattenuation in the supratentorial white matter is nonspecific but may reflect mild chronic microvascular ischemic changes. Vascular: There is atherosclerotic  calcification at the skull base. Skull: Calvarium is unremarkable. Sinuses/Orbits: Paranasal sinus mucosal thickening. Partially imaged right maxillary sinus air-fluid level. Unremarkable orbits. Other: None. IMPRESSION: No acute intracranial hemorrhage or evidence of acute infarction. Probable mild chronic microvascular ischemic changes. Electronically Signed   By: Macy Mis M.D.   On: 06/17/2022 11:52    Procedures Procedures   Medications Ordered in ED Medications - No data to display  ED Course/ Medical Decision Making/ A&P                           Medical Decision Making Amount and/or Complexity of Data Reviewed Labs: ordered. Radiology: ordered.  Risk Decision regarding hospitalization.   Nattalie Santiesteban is a 76 y.o. female PMH A-fib on Eliquis, TIA, syncope, chronic lymphocytic leukemia who is presenting with dizziness and facial droop.    Vitals at presentation within normal  limits.  Patient is hemodynamically stable, afebrile, satting well on room air.  Physical exam concerning for left-sided facial droop.  No other neurologic deficits seen on exam.  Normal heart sounds.  Lungs clear to auscultation bilaterally.  Abdomen is soft, nontender to palpation.  No pitting edema.  Initial differential includes but is not limited to: CVA, peripheral nerve palsy, infection, hypertensive emergency  Lab work obtained, resulted notable for leukocytosis of 33.5, with lymphocytic predominance and smudge cells present, likely consistent with patient's known history of chronic lymphocytic leukemia.  CMP with slight electrolyte derangements, low sodium and chloride.  No evidence of AKI.  No elevated LFTs or anion gap.  UA obtained, not consistent with infectious etiology.  COVID swab negative.  Imaging was pursued, notable for CT head negative for acute intracranial abnormality per radiology read..  EKG obtained, demonstrates sinus rhythm, ventricular rate 61 bpm.  No evidence of abnormal  intervals or acute ischemia.  Interpreted by myself and my attending.  Interventions include IV hydralazine, home p.o. antihypertensives  Neurology was consulted, recommend MRI brain, which is pending.  Patient will benefit from inpatient admission for additional evaluation of her dizziness and left-sided-facial droop.  Patient accepted for admission by internal medicine.  The plan for this patient was discussed with Dr. Jeanell Sparrow, who voiced agreement and who oversaw evaluation and treatment of this patient.   Final Clinical Impression(s) / ED Diagnoses Final diagnoses:  Facial droop  Dizziness    Rx / DC Orders ED Discharge Orders     None         Faylene Million, MD 06/17/22 1442    Pattricia Boss, MD 06/18/22 1700

## 2022-06-17 NOTE — ED Notes (Signed)
Patient transported to CT 

## 2022-06-17 NOTE — ED Notes (Signed)
Pt back from MRI 

## 2022-06-17 NOTE — H&P (Signed)
Date: 06/17/2022               Patient Name:  Karen Bradford MRN: [id number]  DOB: Mar 14, 1946 Age / Sex: 76 y.o., female   PCP: Drake Leach, MD         Medical Service: Internal Medicine Teaching Service         Attending Physician: Dr. Jimmye Norman, Elaina Pattee, MD    First Contact: Dr. Iona Coach  Pager: [phone]  Second Contact: Dr. Sanjuan Dame Pager: [phone]       After Hours (After 5p/  First Contact Pager: [phone]  weekends / holidays): Second Contact Pager: [phone]   Chief Complaint: Facial droop  History of Present Illness:  Karen Bradford 75 y/o  female with a PMHx of atrial fibrillation on eliquis, prior TIA in 2020,  HTN, fibromyalgia, adenocarcinoma of the right breast in 2001 w/p radical mastectomy, CLL, GERD, and anxiety  who presents for left sided facial droop. Fascial droop today was noticed by EMS after she call ed elevated blood pressures. Reports she did not notice facial droop yesterday before going to bed and is unsure when this started. Also reports associated word finding difficulty.   Patient has been in her usual state of health until about 1 week ago after getting back from [facility] she developed a cough producing white sputum. Saw her PCP 1 week ago and was prescribed azelastine and tessalon perles. Continued to have a cough and went to urgent care yesterday, COVID was negative at that time and she was prescribed Augmentin. She has taken 2 doses without improvement. After going home she felt dizzy and unwell, noted BP was elevated to 200/100s and called EMS. She had not taken her BP medication yesterday morning. She then took her BP medications and SMP improved to 170s. She went to bed around 10 pm. This morning woke up around 8:30 am and noted BP again high in 200s and called EMS who noted facial droop.   Patient notes she has been under more stress lately as her husband has developed cognitive decline and requiring more help at home. She does  have a housekeeper who help with taking care of her husband. She denies fever, chills, chest pain, abdominal pain, vomiting, dysuria, falls, focal weakness, vision changes, syncope, or headache.  Meds:  Eliquis 5 mg BID Hydralazine 25 mg three times daily Hctz 12.5 mg daily Lyrica 50 mg twice daily Abilify 2 mg daily Amiodarone 100 mg daily  Mybetriq 25 mg daily Prilosec 40 mg daily Lipitor 40 mg daily Ambien 5 mg daily Cymbalta 60 mg daily  Augmentin, benzonatate for URI at urgent care prescribed yesterday   Allergies: Allergies as of 06/17/2022 - Review Complete 10/14/2019  Allergen Reaction Noted   Colesevelam Rash 08/04/2017   Past Medical History:  Diagnosis Date   Anxiety    Cancer ([place])    breast   Chronic lymphocytic leukemia ([facility])    Depression    GERD (gastroesophageal reflux disease)    Glaucoma    Hypertension    Lymphocytosis    Polyneuropathic pain     Family History: Heart failure (mother), Brain cancer (father)  Social History: Lives at home with husband who has had worsening health and cognitive issues. Has a housekeep Coralyn Mark who helps with her husband. Patient is independent of ADLs and iADLs. Former smoker quit about 40 years ago, drinks 2-3 beers/wine weekly. Denies other illicit drug use.  PCP Dr. Dahlia Client, DO [facility]  Review of Systems: A complete ROS was negative except as per HPI.   Physical Exam: Blood pressure (!) 171/90, pulse 64, temperature 98.1 F (36.7 C), temperature source Oral, resp. rate 12, height '5\' 6"'$  (1.676 m), weight 72.6 kg, SpO2 95 %. Constitutional: Tired appearing in woman laying in bed in no acute distress  HENT: dry mucous membranes, no cervical lymphadenopathy, no tonsillar exudates  Cardiovascular: Normal rate, regular rhythm, no murmurs, rubs, gallops.  Distal pulses intact, no peripheral edema Respiratory:  intermittently coughing, no increased work of breathing, lungs clear to asculation,  no rhonchi or rales  GI: Nondistended, soft, nontender to palpation, normal active bowel sounds Neurological: Is alert and oriented x4, left sided facial droop with diminished sensation of the L forehead, normal strength of upper and lower extremities, no dysmetria on finger to nose Skin: diminished skin turgor Psychiatric: Normal mood and affect. Behavior is normal. Judgment and thought content normal.   EKG: personally reviewed my interpretation is sinus rhythm with HR 61, LVH    CXR: personally reviewed my interpretation is no focal consolidations or infiltrates, no pleural effusion, no pneumothorax    Assessment & Plan by Problem: Principal Problem:   Facial droop  Facial droop secondary to likely TIA History of TIA Patient presenting with facial droop last know well around 10 pm on 8/23. Prior admission in 2020 foe similar presentation found to have new onset atrial fibrillation and started on eliquis. CT head negative and MRI head also without acute stroke. EKG in NSR as well as on telemtry. She has been adherent with anticoagulation. Facial droop TIA vs recrudescence of prior TIA.  -Neurology consulted appreciate recommendations -Eliquis 5 mg twice daily -Start ASA 81 mg dialy - continue atorvastatin 40 mg daily - F/u CT angio head and neck - f/u lipid panel - permissive hypertension in setting of possible TIA - neuro cheks  URI Patient with 1 week of cough. CXR negative for LRI. Leukocytosis likely due to CLL rather than acute infection. She is stable on room air. On chart review she frequently has cough and URI symptoms, PCP ordered PFTs but appears this has not yet been done. - Supportive care with tylenol, guaifenesin, tessalon perles  - Outpatient PFTs  Hyponatremia Na 129 on admission, prior solidum levels appear chronically low in 130s. Suspect component of hypovolemic hyponatremia in setting of poor PO intake with recent URI. - NS 500 mL bolus - Monitor BMP  HTN   On hydralazine, losartan, and HCTZ at home. BP initially in 200s, improved without medication to 170s/90s -permissive hypertension in setting of possible TIA.   Leukocytosis secondary to CLL History of low risk CLL follow by hematology with [facility] in [facility]. WBC 77 in July at hematology office. PET scan on 05/10/2022 w/o evidence of hypermetabolic lymphadenopathy.WBC improved to 33 today. Path smear with lymphocytosis c/w CLL - outpatient hematology follow up  GERD - protonix  Urinary frequency - continue myrbetriq   Fibromyalgia - continue lyrica and duloxetine  Anxiety -continue ability, Ambien  Dispo: Admit patient to Observation with expected length of stay less than 2 midnights.  Signed: Iona Beard, MD 06/17/2022, 1:37 PM  Pager: [phone] After 5pm on weekdays and 1pm on weekends: On Call pager: [phone]

## 2022-06-18 DIAGNOSIS — K219 Gastro-esophageal reflux disease without esophagitis: Secondary | ICD-10-CM | POA: Diagnosis present

## 2022-06-18 DIAGNOSIS — G459 Transient cerebral ischemic attack, unspecified: Secondary | ICD-10-CM

## 2022-06-18 DIAGNOSIS — J069 Acute upper respiratory infection, unspecified: Secondary | ICD-10-CM | POA: Diagnosis present

## 2022-06-18 DIAGNOSIS — E871 Hypo-osmolality and hyponatremia: Secondary | ICD-10-CM

## 2022-06-18 DIAGNOSIS — M797 Fibromyalgia: Secondary | ICD-10-CM

## 2022-06-18 DIAGNOSIS — R350 Frequency of micturition: Secondary | ICD-10-CM

## 2022-06-18 DIAGNOSIS — Z87891 Personal history of nicotine dependence: Secondary | ICD-10-CM | POA: Diagnosis not present

## 2022-06-18 DIAGNOSIS — F419 Anxiety disorder, unspecified: Secondary | ICD-10-CM

## 2022-06-18 DIAGNOSIS — D72829 Elevated white blood cell count, unspecified: Secondary | ICD-10-CM

## 2022-06-18 DIAGNOSIS — Z79899 Other long term (current) drug therapy: Secondary | ICD-10-CM | POA: Diagnosis not present

## 2022-06-18 DIAGNOSIS — B9789 Other viral agents as the cause of diseases classified elsewhere: Secondary | ICD-10-CM | POA: Diagnosis present

## 2022-06-18 DIAGNOSIS — I10 Essential (primary) hypertension: Secondary | ICD-10-CM | POA: Diagnosis present

## 2022-06-18 DIAGNOSIS — K5900 Constipation, unspecified: Secondary | ICD-10-CM | POA: Diagnosis present

## 2022-06-18 DIAGNOSIS — E876 Hypokalemia: Secondary | ICD-10-CM | POA: Diagnosis present

## 2022-06-18 DIAGNOSIS — F32A Depression, unspecified: Secondary | ICD-10-CM | POA: Diagnosis present

## 2022-06-18 DIAGNOSIS — Z8249 Family history of ischemic heart disease and other diseases of the circulatory system: Secondary | ICD-10-CM | POA: Diagnosis not present

## 2022-06-18 DIAGNOSIS — I4891 Unspecified atrial fibrillation: Secondary | ICD-10-CM | POA: Diagnosis present

## 2022-06-18 DIAGNOSIS — C9110 Chronic lymphocytic leukemia of B-cell type not having achieved remission: Secondary | ICD-10-CM

## 2022-06-18 DIAGNOSIS — Z853 Personal history of malignant neoplasm of breast: Secondary | ICD-10-CM | POA: Diagnosis not present

## 2022-06-18 DIAGNOSIS — R29810 Facial weakness: Secondary | ICD-10-CM | POA: Diagnosis present

## 2022-06-18 DIAGNOSIS — Z808 Family history of malignant neoplasm of other organs or systems: Secondary | ICD-10-CM | POA: Diagnosis not present

## 2022-06-18 DIAGNOSIS — G4700 Insomnia, unspecified: Secondary | ICD-10-CM | POA: Diagnosis present

## 2022-06-18 DIAGNOSIS — Z20822 Contact with and (suspected) exposure to covid-19: Secondary | ICD-10-CM | POA: Diagnosis present

## 2022-06-18 DIAGNOSIS — Z7901 Long term (current) use of anticoagulants: Secondary | ICD-10-CM | POA: Diagnosis not present

## 2022-06-18 DIAGNOSIS — E860 Dehydration: Secondary | ICD-10-CM | POA: Diagnosis present

## 2022-06-18 DIAGNOSIS — I69992 Facial weakness following unspecified cerebrovascular disease: Secondary | ICD-10-CM | POA: Diagnosis not present

## 2022-06-18 DIAGNOSIS — R42 Dizziness and giddiness: Secondary | ICD-10-CM | POA: Diagnosis present

## 2022-06-18 LAB — URINE CULTURE: Culture: NO GROWTH

## 2022-06-18 LAB — CBC
HCT: 39.8 % (ref 36.0–46.0)
Hemoglobin: 13.6 g/dL (ref 12.0–15.0)
MCH: 30.8 pg (ref 26.0–34.0)
MCHC: 34.2 g/dL (ref 30.0–36.0)
MCV: 90 fL (ref 80.0–100.0)
Platelets: 370 10*3/uL (ref 150–400)
RBC: 4.42 MIL/uL (ref 3.87–5.11)
RDW: 14.6 % (ref 11.5–15.5)
WBC: 35 10*3/uL — ABNORMAL HIGH (ref 4.0–10.5)
nRBC: 0 % (ref 0.0–0.2)

## 2022-06-18 LAB — BASIC METABOLIC PANEL
Anion gap: 11 (ref 5–15)
BUN: 6 mg/dL — ABNORMAL LOW (ref 8–23)
CO2: 25 mmol/L (ref 22–32)
Calcium: 10.3 mg/dL (ref 8.9–10.3)
Chloride: 94 mmol/L — ABNORMAL LOW (ref 98–111)
Creatinine, Ser: 0.71 mg/dL (ref 0.44–1.00)
GFR, Estimated: >60 mL/min (ref >60)
Glucose, Bld: 106 mg/dL — ABNORMAL HIGH (ref 70–99)
Potassium: 3.3 mmol/L — ABNORMAL LOW (ref 3.5–5.1)
Sodium: 130 mmol/L — ABNORMAL LOW (ref 135–145)

## 2022-06-18 MED ORDER — ARIPIPRAZOLE 2 MG PO TABS
2.0000 mg | ORAL_TABLET | Freq: Every day | ORAL | Status: AC
  Administered 2022-06-18 – 2022-06-21 (×4): 2 mg via ORAL
  Filled 2022-06-18 (×4): qty 1

## 2022-06-18 MED ORDER — LACTATED RINGERS IV BOLUS
1000.0000 mL | Freq: Once | INTRAVENOUS | Status: AC
Start: 2022-06-18 — End: 2022-06-18
  Administered 2022-06-18: 1000 mL via INTRAVENOUS

## 2022-06-18 MED ORDER — SODIUM CHLORIDE 0.9 % IV SOLN: INTRAVENOUS | Status: DC

## 2022-06-18 MED ORDER — LACTATED RINGERS IV SOLN: INTRAVENOUS | Status: DC

## 2022-06-18 NOTE — Progress Notes (Addendum)
Subjective: Patient assessed at the bedside this AM. She felt she was having trouble with word formation, was having some psychomotor slowing, and had fatigue performing the neuro exam. She is reporting headache this morning. She feels weak, dehydrated, and unsafe walking due to dizziness and lightheadedness.  Objective:  Vital signs in last 24 hours: Vitals:   06/17/22 1957 06/17/22 2312 06/18/22 0400 06/18/22 0813  BP: (!) 159/76 (!) 160/79 126/66 (!) 139/57  Pulse: 70 78 68 72  Resp: '12 16 13 14  '$ Temp: 98 F (36.7 C) 98.1 F (36.7 C) 98 F (36.7 C)   TempSrc: Oral Oral Oral   SpO2: 94% 97% 92% 94%  Weight:      Height:       Gen: pleasant, laying in bed, frail HEENT: normocephalic, atraumatic, sclera non-icteric, no choking with eating.  Hearing impaired with conversational volume. CV: RRR, no m/r/g Pulm: LCTAB, normal work of breathing, good air flow Skin: warm, dry, poor turgor about the dorsal hand and chest Neuro: minimal L facial droop at rest, worsening L facial droop with smiling that Is forehead sparing,decreased sensation of the left CN V1 distribution about the forehead, decreased superior peripheral vision of the L eye, mild R sided tongue deviation,otherwise all other cranial nerves intact. Cerebellar exam normal. Psychomotor slowing and some confusion with performing tasks.  Assessment/Plan:  Principal Problem:   Facial droop Karen Bradford 77 y/o  female with a PMHx of atrial fibrillation on eliquis, prior TIA in 2020,  HTN, fibromyalgia, adenocarcinoma of the right breast in 2001 w/p radical mastectomy, CLL, GERD, and anxiety  who presents for left sided facial droop. Fascial droopwas noticed by EMS after she called for elevated BP to the 200s systol. Reports she did not notice facial droop the day prior to ED presentation before going to bed and is unsure when this started. Also reports associated word finding difficulty.   Facial droop secondary to likely  TIA History of TIA Patient presenting with facial droop last know well around 10 pm on 8/23. Prior admission in 2020 foe similar presentation found to have new onset atrial fibrillation and started on eliquis. CT head negative and MRI head also without acute stroke.  CTA head and neck without large vessel occlusion or flow limiting stenosis. Neurology reviewed case and signed off. Passed bedside swallow test and having no choking with eating or drinking. Will need strategies for secondary prevention given increased stroke risk. LDL within goal at 49, BG normal but will need follow up A1c, has had very elevated BP outside the hospital with the need to lower this in the long term. -Eliquis 5 mg twice daily -Start ASA 81 mg dialy - continue atorvastatin 40 mg daily  Deconditioning  Hyponatremia The patient has not been ambulating well since admission and is feeling dizzy and lightheaded when doing so. She has not eaten either until this AM and is feeling week because of this. She has poor skin turgor and dry mucous membranes due to dehydration. She is a high fall risk and is on blood thinners. PT/OT recommending homehealth therapy. The patient's husband has a neurocognitive disorder and is unable to assist her at home. She otherwise has no support at her home. She will require hospitalization today and tonight at least to better hydrate with IV fluids, nourish, and regain strength with  continued ambulation to safely discharge and prevent fall with a high risk of severe bleed. The patient has some chronic hyponatremia most likely due  to thiazide.  Suspect this will correct with IV fluid -Maintenance IV fluids -orthostatic vitals -Monitor BMP   URI Patient with 1 week of cough. CXR negative for LRI. Leukocytosis likely due to CLL rather than acute infection.Symptomatic treatment. Covid negative. - Supportive care with tylenol, guaifenesin, tessalon perles      HTN  On hydralazine, losartan, and HCTZ  at home, currently holding. BP initially in 200s, now to the 127N/170Y systolic off medications. -Continue holding   Leukocytosis secondary to CLL History of low risk CLL follow by hematology with [facility] in [facility]. WBC 77 in July at hematology office. PET scan on 05/10/2022 w/o evidence of hypermetabolic lymphadenopathy.WBC improved to 33 today. Path smear with lymphocytosis c/w CLL - outpatient hematology follow up   GERD - protonix   Urinary frequency - continue myrbetriq    Fibromyalgia - continue lyrica and duloxetine   Anxiety -continue ability, Ambien   Prior to Admission Living Arrangement: Anticipated Discharge Location: Barriers to Discharge: Dispo: Anticipated discharge in approximately 1 day(s).   Iona Coach, MD 06/18/2022, 12:22 PM Pager: [phone] After 5pm on weekdays and 1pm on weekends: On Call pager [phone]

## 2022-06-18 NOTE — Progress Notes (Signed)
Pt refused am medications until after meal tray delivered. Pt given menu and instructed how to order meals. Pt states she will call RN when she is ready for medications.

## 2022-06-18 NOTE — Hospital Course (Signed)
Karen Bradford 76 y/o  female with a PMHx of atrial fibrillation on eliquis, prior TIA in 2020,  HTN, fibromyalgia, adenocarcinoma of the right breast in 2001 w/p radical mastectomy, CLL, GERD, and anxiety  who presents for left sided facial droop. Fascial droopwas noticed by EMS after she called for elevated BP to the 200s systol. Reports she did not notice facial droop the day prior to ED presentation before going to bed and is unsure when this started. Also reports associated word finding difficulty.   Facial droop secondary to likely TIA History of TIA Patient presenting with facial droop last know well around 10 pm on 8/23. Prior admission in 2020 for similar presentation found to have new onset atrial fibrillation and started on eliquis. CT head negative and MRI head also without acute stroke. CT angiogram of the head and neck without large vessel occlusion or flow limiting stenosis.EKG in NSR as well as on telemtry. She has been adherent with anticoagulation. Neurology evaluated the patient and did not feel there was any further work up on the inpatient side. She was started on ASA 81 for secondary prevention.   URI Patient with 1 week of cough. CXR negative for LRI. Leukocytosis likely due to CLL rather than acute infection. She is stable on room air. On chart review she frequently has cough and URI symptoms, PCP ordered PFTs but appears this has not yet been done.Continued supportive care with tylenol, guaifenesin, tessalon perles     Hyponatremia Na 129 on admission, prior solidum levels appear chronically low in 130s. Suspect component of hypovolemic hyponatremia in setting of poor PO intake with recent URI. Given a 525m NS bolus.    HTN  On hydralazine, losartan, and HCTZ at home, held during hospitalization for permissive hypertension. BP initially in 200s, improved without medication to 170s/90s.   Leukocytosis secondary to CLL History of low risk C[facility] at [facility] office. PET scan on 05/10/2022 w/o evidence of hypermetabolic lymphadenopathy.WBC up to 33K during admission. Path smear with lymphocytosis c/w CLL.Will continue with outpatient hematology follow up.   GERD Continued protonix   Urinary frequency Continued myrbetriq    Fibromyalgia Continued lyrica and duloxetine   Anxiety Continued Abilify and Ambien   8/27 Pt reports sleeping well. Reports last bowel was day prior to admission, says she takes miralax at home, has BM every 3-4 days. Continues to endorse weakness and headaches. Discussed rhinovirus + status . Patient reports she has not walked around, encouraged patient to ambulate with assistance by nursing and increase activity today.   PE: Resp: Pt has audible cough.  GI: MSK: no pain on palpation of LE.

## 2022-06-18 NOTE — Care Management Obs Status (Signed)
[place] NOTIFICATION   Patient Details  Name: Karen Bradford MRN: [id number] Date of Birth: 1945/12/09   Medicare Observation Status Notification Given:  Yes    Pollie Friar, RN 06/18/2022, 2:01 PM

## 2022-06-18 NOTE — Evaluation (Signed)
Physical Therapy Evaluation Patient Details Name: Karen Bradford MRN: [id number] DOB: 10/05/1946 Today's Date: 06/18/2022  History of Present Illness  Pt is a 76 y/o female admitted secondary to L facial droop and HTN. PMH includes a fib, HTN, fibromyalgia, R breast cancer, CLL, and anxiety.  Clinical Impression  Pt admitted secondary to problem above with deficits below. Pt requiring min guard A for mobility tasks using RW. Pt reporting increased weakness from baseline. Recommending HHPT at d/c to address current deficits. Will continue to follow acutely.        Recommendations for follow up therapy are one component of a multi-disciplinary discharge planning process, led by the attending physician.  Recommendations may be updated based on patient status, additional functional criteria and insurance authorization.  Follow Up Recommendations Home health PT      Assistance Recommended at Discharge Intermittent Supervision/Assistance  Patient can return home with the following  Assistance with cooking/housework    Equipment Recommendations None recommended by PT  Recommendations for Other Services       Functional Status Assessment Patient has had a recent decline in their functional status and demonstrates the ability to make significant improvements in function in a reasonable and predictable amount of time.     Precautions / Restrictions Precautions Precautions: Fall Restrictions Weight Bearing Restrictions: No      Mobility  Bed Mobility Overal bed mobility: Needs Assistance Bed Mobility: Supine to Sit, Sit to Supine     Supine to sit: Supervision Sit to supine: Supervision   General bed mobility comments: Supervision for safety.    Transfers Overall transfer level: Needs assistance Equipment used: Rolling walker (2 wheels) Transfers: Sit to/from Stand Sit to Stand: Min guard           General transfer comment: Min guard for safety.     Ambulation/Gait Ambulation/Gait assistance: Min guard Gait Distance (Feet): 75 Feet Assistive device: Rolling walker (2 wheels) Gait Pattern/deviations: Step-through pattern, Decreased stride length Gait velocity: Decreased     General Gait Details: Slow, cautious gait, but no LOB noted. Min guard for safety.  Stairs            Wheelchair Mobility    Modified Rankin (Stroke Patients Only)       Balance Overall balance assessment: Needs assistance Sitting-balance support: No upper extremity supported, Feet supported Sitting balance-Leahy Scale: Fair     Standing balance support: Bilateral upper extremity supported Standing balance-Leahy Scale: Poor Standing balance comment: Reliant on BUE support                             Pertinent Vitals/Pain Pain Assessment Pain Assessment: No/denies pain    Home Living Family/patient expects to be discharged to:: Private residence Living Arrangements: Spouse/significant other Available Help at Discharge: Family;Available 24 hours/day Type of Home: House Home Access: Stairs to enter Entrance Stairs-Rails: Left Entrance Stairs-Number of Steps: 4   Home Layout: Two level;Able to live on main level with bedroom/bathroom Home Equipment: Shower seat;Grab bars - tub/shower;Rolling Walker (2 wheels)      Prior Function Prior Level of Function : Independent/Modified Independent;Driving                     Hand Dominance        Extremity/Trunk Assessment   Upper Extremity Assessment Upper Extremity Assessment: Generalized weakness    Lower Extremity Assessment Lower Extremity Assessment: Generalized weakness    Cervical / Trunk Assessment  Cervical / Trunk Assessment: Normal  Communication   Communication: Expressive difficulties  Cognition Arousal/Alertness: Awake/alert Behavior During Therapy: WFL for tasks assessed/performed Overall Cognitive Status: Within Functional Limits for tasks  assessed                                          General Comments      Exercises     Assessment/Plan    PT Assessment Patient needs continued PT services  PT Problem List Decreased strength;Decreased activity tolerance;Decreased balance;Decreased mobility       PT Treatment Interventions DME instruction;Gait training;Stair training;Functional mobility training;Therapeutic activities;Therapeutic exercise;Balance training;Patient/family education    PT Goals (Current goals can be found in the Care Plan section)  Acute Rehab PT Goals Patient Stated Goal: to go home PT Goal Formulation: With patient Time For Goal Achievement: 07/02/22 Potential to Achieve Goals: Good    Frequency Min 3X/week     Co-evaluation               AM-PAC PT "6 Clicks" Mobility  Outcome Measure Help needed turning from your back to your side while in a flat bed without using bedrails?: None Help needed moving from lying on your back to sitting on the side of a flat bed without using bedrails?: A Little Help needed moving to and from a bed to a chair (including a wheelchair)?: A Little Help needed standing up from a chair using your arms (e.g., wheelchair or bedside chair)?: A Little Help needed to walk in hospital room?: A Little Help needed climbing 3-5 steps with a railing? : A Little 6 Click Score: 19    End of Session Equipment Utilized During Treatment: Gait belt Activity Tolerance: Patient tolerated treatment well Patient left: in bed;with call bell/phone within reach Nurse Communication: Mobility status PT Visit Diagnosis: Unsteadiness on feet (R26.81);Muscle weakness (generalized) (M62.81);Difficulty in walking, not elsewhere classified (R26.2)    Time: 1287-8676 PT Time Calculation (min) (ACUTE ONLY): 15 min   Charges:   PT Evaluation $PT Eval Low Complexity: 1 Low          Reuel Derby, PT, DPT  Acute Rehabilitation Services  Office: [phone]   Rudean Hitt 06/18/2022, 12:09 PM

## 2022-06-18 NOTE — Discharge Summary (Signed)
Name: Suheyla Mortellaro MRN: [id number] DOB: 1946/08/24 76 y.o. PCP: Deneise Lever, DO  Date of Admission: 06/17/2022 10:25 AM Date of Discharge: No discharge date for patient encounter. Attending Physician: Angelica Pou, MD  Discharge Diagnosis: 1. Principal Problem:   Facial droop  ***  Discharge Medications: Allergies as of 06/18/2022       Reactions   Colesevelam Rash     Med Rec must be completed prior to using this [facility]***       Disposition and follow-up:   Ms.Tashauna Acoff was discharged from [facility] in {DISCHARGE CONDITION:19696} condition.  At the hospital follow up visit please address:  1.   L facial droop 2/2 TIA vs recrudescence of prior stroke -Started ASA '81mg'$  for secondary prevention -Would recommend A1c( last in 09/2020)  Uncontrolled HTN -Consider additional antihypertensive agents  2.  Labs / imaging needed at time of follow-up: A1c  3.  Pending labs/ test needing follow-up: NA  Follow-up Appointments:  Will need 1 week hospital follow up  Hospital Course by problem list: Luellen Howson 76 y/o  female with a PMHx of atrial fibrillation on eliquis, prior TIA in 2020,  HTN, fibromyalgia, adenocarcinoma of the right breast in 2001 w/p radical mastectomy, CLL, GERD, and anxiety  who presents for left sided facial droop. Fascial droopwas noticed by EMS after she called for elevated BP to the 200s systol. Reports she did not notice facial droop the day prior to ED presentation before going to bed and is unsure when this started. Also reports associated word finding difficulty.   Facial droop secondary to likely TIA History of TIA Patient presenting with facial droop last know well around 10 pm on 8/23. Prior admission in 2020 for similar presentation found to have new onset atrial fibrillation and started on eliquis. CT head negative and MRI head also without acute stroke. CT angiogram of the head and neck without  large vessel occlusion or flow limiting stenosis.EKG in NSR as well as on telemtry. She has been adherent with anticoagulation. Neurology evaluated the patient and did not feel there was any further work up on the inpatient side. She was started on ASA 81 for secondary prevention. LDL was 49 and within goal range. No A1c performed in hospital.   URI Patient with 1 week of cough. CXR negative for LRI. Leukocytosis likely due to CLL rather than acute infection. She is stable on room air. On chart review she frequently has cough and URI symptoms, PCP ordered PFTs but appears this has not yet been done.Continued supportive care with tylenol, guaifenesin, tessalon perles     Hyponatremia Na 129 on admission, prior solidum levels appear chronically low in 130s. Suspect component of hypovolemic hyponatremia in setting of poor PO intake with recent URI. Given a 550m NS bolus and discharged with sodium at 130..Marland Kitchen   HTN  On hydralazine, losartan, and HCTZ at home, held during hospitalization for permissive hypertension. BP initially in 200s, improved without medication to 170s/90s.   Leukocytosis secondary to CLL History of low risk [facility] in July at hematology office. PET scan on 05/10/2022 w/o evidence of hypermetabolic lymphadenopathy.WBC up to 33K during admission. Path smear with lymphocytosis c/w CLL.Will continue with outpatient hematology follow up.   GERD Continued protonix   Urinary frequency Continued myrbetriq    Fibromyalgia Continued lyrica and duloxetine   Anxiety Continued Abilify and Ambien  Discharge Exam:  BP 126/66 (BP Location: Left Arm)   Pulse 68   Temp 98 F (36.7 C) (Oral)   Resp 13   Ht '5\' 6"'$  (1.676 m)   Wt 72.6 kg   SpO2 92%   BMI 25.82 kg/m  Discharge exam:  Gen: HEENT: CV: Pulm: Abd: MSK: Skin:  Pertinent Labs, Studies, and Procedures:     Latest Ref Rng & Units 06/18/2022    5:08 AM 06/17/2022    10:51 AM 10/15/2019    5:38 AM  CBC  WBC 4.0 - 10.5 K/uL 35.0  33.5  13.8   Hemoglobin 12.0 - 15.0 g/dL 13.6  13.7  11.6   Hematocrit 36.0 - 46.0 % 39.8  40.9  35.4   Platelets 150 - 400 K/uL 370  378  361        Latest Ref Rng & Units 06/18/2022    5:08 AM 06/17/2022   10:51 AM 10/15/2019    5:38 AM  BMP  Glucose 70 - 99 mg/dL 106  133  110   BUN 8 - 23 mg/dL '6  6  9   '$ Creatinine 0.44 - 1.00 mg/dL 0.71  0.64  0.66   Sodium 135 - 145 mmol/L 130  129  133   Potassium 3.5 - 5.1 mmol/L 3.3  3.9  4.4   Chloride 98 - 111 mmol/L 94  94  100   CO2 22 - 32 mmol/L '25  26  25   '$ Calcium 8.9 - 10.3 mg/dL 10.3  10.3  9.5    CT ANGIO HEAD NECK W WO CM  Result Date: 06/17/2022 CLINICAL DATA:  Acute neuro deficit. Right facial droop. Dizziness. EXAM: CT ANGIOGRAPHY HEAD AND NECK TECHNIQUE: Multidetector CT imaging of the head and neck was performed using the standard protocol during bolus administration of intravenous contrast. Multiplanar CT image reconstructions and MIPs were obtained to evaluate the vascular anatomy. Carotid stenosis measurements (when applicable) are obtained utilizing NASCET criteria, using the distal internal carotid diameter as the denominator. RADIATION DOSE REDUCTION: This exam was performed according to the departmental dose-optimization program which includes automated exposure control, adjustment of the mA and/or kV according to patient size and/or use of iterative reconstruction technique. CONTRAST:  57m OMNIPAQUE IOHEXOL 350 MG/ML SOLN COMPARISON:  CT head and MRI head 06/17/2022 FINDINGS: CTA NECK FINDINGS Aortic arch: Standard branching. Imaged portion shows no evidence of aneurysm or dissection. No significant stenosis of the major arch vessel origins. Right carotid system: Right carotid widely patent. No atherosclerotic disease or dissection. Left carotid system: Left carotid widely patent. Negative for atherosclerotic disease or dissection Vertebral arteries: Left vertebral  dominant. No vertebral artery stenosis or occlusion. Skeleton: Cervical spondylosis.  No acute abnormality. Other neck: No soft tissue mass or adenopathy in the neck. 3 mm calcification right thyroid without nodularity. No further imaging necessary. (Ref: J Am Coll Radiol. 2015 Feb;12(2): 143-50). Air-fluid level and mucosal edema right maxillary sinus. Upper chest: Lung apices clear bilaterally. Review of the MIP images confirms the above findings CTA HEAD FINDINGS Anterior circulation: Internal carotid artery widely patent bilaterally. Mild atherosclerotic calcification cavernous carotid bilaterally without stenosis. Anterior and middle cerebral arteries normal without stenosis or aneurysm. Posterior circulation: Both vertebral arteries patent to the basilar. Left vertebral artery dominant. PICA patent bilaterally. Basilar patent. Fetal origin right posterior cerebral artery. Both posterior cerebral arteries are patent without stenosis. Venous sinuses: Limited venous enhancement due to arterial phase scanning Anatomic variants: None Review of the MIP images confirms the above  findings IMPRESSION: 1. Negative for intracranial large vessel occlusion or flow limiting stenosis. 2. Negative CT angiogram of the neck. No carotid or vertebral artery stenosis Electronically Signed   By: Franchot Gallo M.D.   On: 06/17/2022 17:46   MR BRAIN WO CONTRAST  Result Date: 06/17/2022 CLINICAL DATA:  Provided history: Neuro deficit, acute, stroke suspected. EXAM: MRI HEAD WITHOUT CONTRAST TECHNIQUE: Multiplanar, multiecho pulse sequences of the brain and surrounding structures were obtained without intravenous contrast. COMPARISON:  Head CT 06/17/2022. Brain MRI 10/14/2019. FINDINGS: Mild intermittent motion degradation. Brain: Mild generalized cerebral atrophy. Multifocal T2 FLAIR hyperintense signal abnormality within the cerebral white matter, nonspecific but compatible with mild chronic small vessel ischemic disease. There  is no acute infarct. No evidence of an intracranial mass. No chronic intracranial blood products. No extra-axial fluid collection. No midline shift. Vascular: Maintained flow voids within the proximal large arterial vessels. Skull and upper cervical spine: No focal suspicious marrow lesion. Incompletely assessed cervical spondylosis. Notably, a C3-C4 posterior disc osteophyte complex contributes to apparent moderate spinal canal stenosis. Sinuses/Orbits: No mass or acute finding within the imaged orbits. Prior bilateral ocular lens replacement. Trace cecal thickening within the right frontal sinus. Mild mucosal thickening within the bilateral ethmoid and sphenoid sinuses. Small fluid level, and background mild-to-moderate focal thickening within the right maxillary sinus. Mild mucosal thickening within the left maxillary sinus. Other: Trace fluid within the right mastoid air cells. IMPRESSION: 1. Mildly motion degraded exam. 2. No evidence of acute intracranial abnormality. 3. Mild chronic small vessel ischemic changes within the cerebral white matter, similar to the prior MRI of 10/14/2019. 4. Mild generalized cerebral atrophy. 5. Paranasal sinus disease, as described. 6. Incompletely assessed cervical spondylosis. Notably, a C3-C4 posterior disc osteophyte complex contributes to apparent moderate spinal canal stenosis. A cervical spine MRI may be obtained for further evaluation, as clinically warranted. Electronically Signed   By: Kellie Simmering D.O.   On: 06/17/2022 14:33   DG Chest Portable 1 View  Result Date: 06/17/2022 CLINICAL DATA:  Leukocytosis EXAM: PORTABLE CHEST 1 VIEW COMPARISON:  09/25/2021 FINDINGS: The heart size and mediastinal contours are within normal limits. Aortic atherosclerosis. No focal airspace consolidation, pleural effusion, or pneumothorax. The visualized skeletal structures are unremarkable. IMPRESSION: No active disease. Electronically Signed   By: Davina Poke D.O.   On:  06/17/2022 12:45   CT Head Wo Contrast  Result Date: 06/17/2022 CLINICAL DATA:  Neuro deficit, acute, stroke suspected EXAM: CT HEAD WITHOUT CONTRAST TECHNIQUE: Contiguous axial images were obtained from the base of the skull through the vertex without intravenous contrast. RADIATION DOSE REDUCTION: This exam was performed according to the departmental dose-optimization program which includes automated exposure control, adjustment of the mA and/or kV according to patient size and/or use of iterative reconstruction technique. COMPARISON:  2020 FINDINGS: Brain: There is no acute intracranial hemorrhage, mass effect, or edema. Gray-white differentiation is preserved. There is no extra-axial fluid collection. Ventricles and sulci are stable in size and configuration. Patchy hypoattenuation in the supratentorial white matter is nonspecific but may reflect mild chronic microvascular ischemic changes. Vascular: There is atherosclerotic calcification at the skull base. Skull: Calvarium is unremarkable. Sinuses/Orbits: Paranasal sinus mucosal thickening. Partially imaged right maxillary sinus air-fluid level. Unremarkable orbits. Other: None. IMPRESSION: No acute intracranial hemorrhage or evidence of acute infarction. Probable mild chronic microvascular ischemic changes. Electronically Signed   By: Macy Mis M.D.   On: 06/17/2022 11:52     Discharge Instructions:   Signed: Iona Coach, MD  06/18/2022, 7:38 AM   Pager: [phone]

## 2022-06-18 NOTE — TOC Initial Note (Signed)
Transition of Care [facility]) - Initial/Assessment Note    Patient Details  Name: Karen Bradford MRN: [id number] Date of Birth: 25-Aug-1946  Transition of [facility]) CM/SW Contact:    Pollie Friar, RN Phone Number: 06/18/2022, 2:05 PM  Clinical Narrative:                 Patient is from home with spouse that has newly been diagnosed with dementia. She also has a Chartered certified accountant that is there daily to assist with home care needs.  Pt is interested in finding someone else to assist at home. CM  has updated her that her insurance will not cover the cost for his.  Home health PT recommended. Pt without a preference. CM has arranged [place] with [facility]. Information on the AVS.  No DME needs.  Pt does the driving for the household and manages her own medications.  ToC following.  Expected Discharge Plan: [place] Barriers to Discharge: Continued Medical Work up   Patient Goals and CMS Choice   CMS Medicare.gov Compare Post Acute Care list provided to:: Patient Choice offered to / list presented to : Patient  Expected Discharge Plan and Services Expected Discharge Plan: [place]   Discharge Planning Services: CM Consult Post Acute Care Choice: [place] arrangements for the past 2 months: [place]: PT [place]: [place] Date [place]: 06/18/22   Representative spoke with at [place]: [name]  Prior Living Arrangements/Services Living arrangements for the past 2 months: [place] Lives with:: Spouse Patient language and need for interpreter reviewed:: Yes Do you feel safe going back to the place where you live?: Yes          Current home services: DME (walker/ cane/ shower seat) Criminal Activity/Legal Involvement Pertinent to Current Situation/Hospitalization: No - Comment as needed  Activities of Daily Living      Permission Sought/Granted                   Emotional Assessment Appearance:: Appears stated age Attitude/Demeanor/Rapport: Engaged Affect (typically observed): Accepting Orientation: : Oriented to Self, Oriented to Place, Oriented to  Time, Oriented to Situation   Psych Involvement: No (comment)  Admission diagnosis:  Dizziness [R42] Facial droop [R29.810] Patient Active Problem List   Diagnosis Date Noted   Facial droop 06/17/2022   Syncope 10/14/2019   Atrial fibrillation ([place]) 10/14/2019   Leukocytosis 10/14/2019   Hyponatremia 10/14/2019   TIA (transient ischemic attack) 10/13/2019   PCP:  Deneise Lever, DO Pharmacy:   [facility], [place] - [address] [place] [address] Phone: [phone] Fax: [phone]  [facility] [address] [place] [address] Phone: [phone] Fax: [phone]     Social Determinants of Health (SDOH) Interventions    Readmission Risk Interventions    10/15/2019   11:11 AM  Readmission Risk Prevention Plan  Post Dischage Appt Complete  Medication Screening Complete  Transportation Screening Complete

## 2022-06-19 DIAGNOSIS — E871 Hypo-osmolality and hyponatremia: Secondary | ICD-10-CM | POA: Diagnosis not present

## 2022-06-19 DIAGNOSIS — G459 Transient cerebral ischemic attack, unspecified: Secondary | ICD-10-CM | POA: Diagnosis not present

## 2022-06-19 DIAGNOSIS — J069 Acute upper respiratory infection, unspecified: Secondary | ICD-10-CM | POA: Diagnosis not present

## 2022-06-19 DIAGNOSIS — I10 Essential (primary) hypertension: Secondary | ICD-10-CM | POA: Diagnosis not present

## 2022-06-19 LAB — RESPIRATORY PANEL BY PCR

## 2022-06-19 LAB — BASIC METABOLIC PANEL
Anion gap: 8 (ref 5–15)
BUN: 7 mg/dL — ABNORMAL LOW (ref 8–23)
CO2: 26 mmol/L (ref 22–32)
Calcium: 9.9 mg/dL (ref 8.9–10.3)
Chloride: 99 mmol/L (ref 98–111)
Creatinine, Ser: 0.64 mg/dL (ref 0.44–1.00)
GFR, Estimated: >60 mL/min (ref >60)
Glucose, Bld: 109 mg/dL — ABNORMAL HIGH (ref 70–99)
Potassium: 3.5 mmol/L (ref 3.5–5.1)
Sodium: 133 mmol/L — ABNORMAL LOW (ref 135–145)

## 2022-06-19 MED ORDER — ASPIRIN 81 MG PO CHEW
81.0000 mg | CHEWABLE_TABLET | Freq: Every day | ORAL | Status: DC
  Administered 2022-06-19 – 2022-06-21 (×3): 81 mg via ORAL
  Filled 2022-06-19 (×3): qty 1

## 2022-06-19 MED ORDER — LOSARTAN POTASSIUM 50 MG PO TABS
50.0000 mg | ORAL_TABLET | Freq: Every day | ORAL | Status: DC
  Administered 2022-06-19 – 2022-06-21 (×3): 50 mg via ORAL
  Filled 2022-06-19 (×3): qty 1

## 2022-06-19 MED ORDER — ENSURE ENLIVE PO LIQD
237.0000 mL | Freq: Two times a day (BID) | ORAL | Status: DC
Start: 2022-06-19 — End: 2022-06-21
  Administered 2022-06-19 – 2022-06-21 (×5): 237 mL via ORAL

## 2022-06-19 MED ORDER — LACTATED RINGERS IV SOLN: INTRAVENOUS | Status: AC

## 2022-06-19 MED ORDER — TRAZODONE HCL 50 MG PO TABS
50.0000 mg | ORAL_TABLET | Freq: Every day | ORAL | Status: DC
  Administered 2022-06-19 – 2022-06-20 (×2): 50 mg via ORAL
  Filled 2022-06-19 (×2): qty 1

## 2022-06-19 NOTE — Plan of Care (Signed)
  Problem: Education: Goal: Knowledge of General Education information will improve Description: Including pain rating scale, medication(s)/side effects and non-pharmacologic comfort measures Outcome: Progressing   Problem: Clinical Measurements: Goal: Ability to maintain clinical measurements within normal limits will improve Outcome: Progressing   Problem: Clinical Measurements: Goal: Diagnostic test results will improve Outcome: Progressing   

## 2022-06-19 NOTE — Progress Notes (Signed)
Subjective: Patient assessed at the bedside this AM. Feeling better and like she is able to express herself better with improving word finding difficulty. Said she felt good walking with PT but did get dizzy and lightheaded with standing. Says she is not hungry, but without N/V. Also says she is still feeling extremely weak.  Objective:  Vital signs in last 24 hours: Vitals:   06/19/22 0810 06/19/22 1058 06/19/22 1059 06/19/22 1100  BP: (!) 142/74 (!) 164/74 (!) 171/89 (!) 173/78  Pulse: 74 68 67 64  Resp: '16 17 16 16  '$ Temp: 97.6 F (36.4 C)  97.7 F (36.5 C) 97.7 F (36.5 C)  TempSrc: Oral  Oral Oral  SpO2: 99% 99% 95% 94%  Weight:      Height:      Physical exam: Grossly unchanged from yesterday Gen: pleasant, laying in bed, frail HEENT: normocephalic, atraumatic, sclera non-icteric, no choking with eating.  Hearing impaired with conversational volume. CV: RRR, no m/r/g Pulm: LCTAB, normal work of breathing, good air flow, dry course cough Skin: warm, dry, poor turgor about the dorsal hand and chest Neuro: minimal L facial droop with smiling,otherwise CN 2-12 grossly intact. Cerebellar exam normal. Psychomotor slowing and some confusion improving from prior days  Assessment/Plan:  Principal Problem:   Facial droop Karen Bradford 76 y/o  female with a PMHx of atrial fibrillation on eliquis, prior TIA in 2020,  HTN, fibromyalgia, adenocarcinoma of the right breast in 2001 w/p radical mastectomy, CLL, GERD, and anxiety  who presents for left sided facial droop. Facial droop was noticed by EMS after she called for elevated BP to the 200s systol. Reports she did not notice facial droop the day prior to ED presentation before going to bed and is unsure when this started. Also reports associated word finding difficulty.   Facial droop secondary to likely TIA-resolved History of TIA No evidence of stroke and TIA symptoms now resolving.Will need strategies for secondary prevention given  increased stroke risk. LDL within goal at 49, BG normal but will need follow up A1c, has had very elevated BP outside the hospital with the need to lower this in the long term. BP today up to 283M systolic. Will start losartan '50mg'$ . -Eliquis 5 mg twice daily -ASA 81 mg dialy - continue atorvastatin 40 mg daily  HTN  On hydralazine, losartan, and HCTZ at home, currently holding. BP initially in 200s, then to 120s/130s with volume depletion, now to 170's with volume repletion. Will start losartan '50mg'$ . -Start losartan 50 -AM BMP -Continue holding hydralazine and hctz  Deconditioning  Hyponatremia The patient is having improving ability to ambulate per work with PT. She still feels weak and this is likely due to hospitalization and poor oral intake recently.The patient has some chronic hyponatremia most likely due to thiazide and poor PO intake. Sodium is now to 133 today with IVF. Orthostatic vitals were negative. -Maintenance IV fluids -orthostatic vitals -Monitor BMP   URI Patient with 1 week of cough with chills and diffuse muscle aches, saw urgent care twice who prescribed augmentin. CXR negative for LRI on admission. Covid negative. RPP positive for adenovirus. - Supportive care with tylenol, guaifenesin, tessalon perles    Leukocytosis secondary to CLL History of low risk CLL follow by hematology with [facility] in [facility]. WBC 77 in July at hematology office. PET scan on 05/10/2022 w/o evidence of hypermetabolic lymphadenopathy.WBC improved to 33 today. Path smear with lymphocytosis c/w CLL - outpatient hematology follow up  GERD - protonix   Urinary frequency - continue myrbetriq    Fibromyalgia - continue lyrica and duloxetine   Anxiety -continue abilify, Ambien  Insomnia -trazadone '50mg'$  nightly   Prior to Admission Living Arrangement: Anticipated Discharge Location: Barriers to Discharge: Dispo: Anticipated discharge in approximately 1 day(s).   Iona Coach,  MD 06/19/2022, 1:42 PM Pager: [phone] After 5pm on weekdays and 1pm on weekends: On Call pager [phone]

## 2022-06-20 DIAGNOSIS — B348 Other viral infections of unspecified site: Secondary | ICD-10-CM

## 2022-06-20 DIAGNOSIS — E876 Hypokalemia: Secondary | ICD-10-CM

## 2022-06-20 DIAGNOSIS — I10 Essential (primary) hypertension: Secondary | ICD-10-CM

## 2022-06-20 DIAGNOSIS — K5900 Constipation, unspecified: Secondary | ICD-10-CM

## 2022-06-20 LAB — BASIC METABOLIC PANEL
Anion gap: 8 (ref 5–15)
BUN: 5 mg/dL — ABNORMAL LOW (ref 8–23)
CO2: 27 mmol/L (ref 22–32)
Calcium: 9.5 mg/dL (ref 8.9–10.3)
Chloride: 99 mmol/L (ref 98–111)
Creatinine, Ser: 0.62 mg/dL (ref 0.44–1.00)
GFR, Estimated: >60 mL/min (ref >60)
Glucose, Bld: 97 mg/dL (ref 70–99)
Potassium: 3.3 mmol/L — ABNORMAL LOW (ref 3.5–5.1)
Sodium: 134 mmol/L — ABNORMAL LOW (ref 135–145)

## 2022-06-20 MED ORDER — POTASSIUM CHLORIDE CRYS ER 20 MEQ PO TBCR
40.0000 meq | EXTENDED_RELEASE_TABLET | Freq: Once | ORAL | Status: AC
  Administered 2022-06-20: 40 meq via ORAL
  Filled 2022-06-20: qty 2

## 2022-06-20 MED ORDER — POLYETHYLENE GLYCOL 3350 17 G PO PACK
17.0000 g | PACK | Freq: Every day | ORAL | Status: DC
Start: 2022-06-20 — End: 2022-06-21
  Administered 2022-06-20 – 2022-06-21 (×2): 17 g via ORAL
  Filled 2022-06-20 (×2): qty 1

## 2022-06-20 NOTE — Progress Notes (Cosign Needed Addendum)
NAME:  Karen Bradford, MRN:  [id number], DOB:  07/04/1946, LOS: 2 ADMISSION DATE:  06/17/2022  Subjective  Patient evaluated at bedside this AM. Slept well, but continues to endorse weakness and headaches. Reports last bowel was day prior to admission, says she takes miralax at home, has BM every 3-4 days. Continues to endorse weakness and headaches. Patient reports she has not walked around, encouraged patient to ambulate with assistance by nursing and increase activity today. Discussed possible discharge later today.   PM Update: Karen Bradford reports she continues to not feel very well. Was able to get up and walk around some, but felt wobbly on her feet. She is unsure if she feels safe to go home at this point. Mentions that she does not have any help at home given her husband's condition. Discussed plan to keep her here another night, continue working with physical therapy.   Objective   Blood pressure (!) 157/69, pulse 67, temperature 98 F (36.7 C), temperature source Oral, resp. rate 18, height '5\' 6"'$  (1.676 m), weight 72.6 kg, SpO2 98 %.    No intake or output data in the 24 hours ending 06/20/22 1745 Filed Weights   06/17/22 1038  Weight: 72.6 kg   Physical Exam: General: Resting comfortably in no acute distress CV: Regular rate, rhythm. No murmurs. Warm extremities. Pulm: Normal work of breathing on room air. Clear to ausculation bilaterally. Abd: Soft, non-tender, non-distended. Normoactive bowel sounds. MSK: Normal bulk, tone. No peripheral edema appreciated. Skin: Warm, dry. Good skin turgor.  Neuro: Awake, alert, conversing appropriately. Stable, minimal L facial droop. Otherwise non-focal. Psych: Normal mood, affect, speech.   Labs       Latest Ref Rng & Units 06/18/2022    5:08 AM 06/17/2022   10:51 AM 10/15/2019    5:38 AM  CBC  WBC 4.0 - 10.5 K/uL 35.0  33.5  13.8   Hemoglobin 12.0 - 15.0 g/dL 13.6  13.7  11.6   Hematocrit 36.0 - 46.0 % 39.8  40.9  35.4   Platelets  150 - 400 K/uL 370  378  361       Latest Ref Rng & Units 06/20/2022    2:49 AM 06/19/2022    5:52 AM 06/18/2022    5:08 AM  BMP  Glucose 70 - 99 mg/dL 97  109  106   BUN 8 - 23 mg/dL '5  7  6   '$ Creatinine 0.44 - 1.00 mg/dL 0.62  0.64  0.71   Sodium 135 - 145 mmol/L 134  133  130   Potassium 3.5 - 5.1 mmol/L 3.3  3.5  3.3   Chloride 98 - 111 mmol/L 99  99  94   CO2 22 - 32 mmol/L '27  26  25   '$ Calcium 8.9 - 10.3 mg/dL 9.5  9.9  10.3    Summary   Karen Bradford is 76yo person with atrial fibrillation on Eliquis, previous TIA, hypertension, fibromyalgia, adenocarcinoma of right breast s/p radical mastectomy, CLL, GERD admitted 8/24 with TIA, found to have upper viral respiratory illness.   Assessment & Plan:  Principal Problem:   TIA (transient ischemic attack) Active Problems:   Hypokalemia   Rhinovirus infection   Essential hypertension   Constipation  #Transient ischemic attack No new neurological deficits, stable. Given prolonged deficit with negative MRI could represent reversible ischemic neurologic deficit. Will continue with current medication. - Eliquis '5mg'$  twice daily - Aspirin '81mg'$  daily - Atorvastatin '40mg'$  daily  #Upper respiratory  infection 2/2 rhinovirus Patient has had upper respiratory infection symptoms for roughly a week now. Respiratory viral panel yesterday positive for rhinovirus. This morning patient reports continued weakness and fatigue, expected with viral infection. She has not had much of an appetite as well. We discussed this morning getting out of bed and ambulating with nursing staff. We will re-evaluate this afternoon. If she is feeling better, we will plan to discharge her home with follow-up planned in the next 1-2 weeks. - Continue supportive care - Home PT/OT  #Hypertension Blood pressure normal here while holding a few of her hypertensive medications. Suspect this is likely due to current illness, poor po intake. Will continue with her current  regimen if discharged today, will have her hold the hydralazine and thiazide until she can follow-up with her primary care.  - Losartan '50mg'$  daily - Hold home hydralazine, hydrochlorothiazide  #Constipation Patient has not had bowel movement in a few days. Her abdomen soft, non-tender, no acute abdomen. Will re-start home Miralax. - Miralax 17g daily  #Hypokalemia K 3.3 likely 2/2 poor po intake, will replete.  Expect to improve as po intake improves. - KlorCon 71mq x1  #Hyponatremia Improving since HCTZ has been held, most likely due to this. Will continue holding at discharge.   Best practice:  DIET: HH IVF: na DVT PPX: eliquis BOWEL: miralax CODE: FULL FAM COM: na  PSanjuan Dame MD Internal Medicine Resident PGY-3 PAGER: [phone]/27/2023 5:45 PM  If after hours (below), please contact on-call pager: [phone]-7AM Monday-Friday 1PM-7AM Saturday-Sunday

## 2022-06-21 DIAGNOSIS — E876 Hypokalemia: Secondary | ICD-10-CM

## 2022-06-21 DIAGNOSIS — I69992 Facial weakness following unspecified cerebrovascular disease: Secondary | ICD-10-CM | POA: Diagnosis not present

## 2022-06-21 DIAGNOSIS — B348 Other viral infections of unspecified site: Secondary | ICD-10-CM

## 2022-06-21 DIAGNOSIS — G4700 Insomnia, unspecified: Secondary | ICD-10-CM

## 2022-06-21 DIAGNOSIS — E871 Hypo-osmolality and hyponatremia: Secondary | ICD-10-CM | POA: Diagnosis not present

## 2022-06-21 DIAGNOSIS — G459 Transient cerebral ischemic attack, unspecified: Secondary | ICD-10-CM | POA: Diagnosis not present

## 2022-06-21 LAB — BASIC METABOLIC PANEL
Anion gap: 6 (ref 5–15)
BUN: 7 mg/dL — ABNORMAL LOW (ref 8–23)
CO2: 26 mmol/L (ref 22–32)
Calcium: 9.4 mg/dL (ref 8.9–10.3)
Chloride: 101 mmol/L (ref 98–111)
Creatinine, Ser: 0.69 mg/dL (ref 0.44–1.00)
GFR, Estimated: >60 mL/min (ref >60)
Glucose, Bld: 100 mg/dL — ABNORMAL HIGH (ref 70–99)
Potassium: 4.3 mmol/L (ref 3.5–5.1)
Sodium: 133 mmol/L — ABNORMAL LOW (ref 135–145)

## 2022-06-21 MED ORDER — ASPIRIN 81 MG PO CHEW
81.0000 mg | CHEWABLE_TABLET | Freq: Every day | ORAL | 3 refills | Status: AC
Start: 2022-06-21 — End: ?

## 2022-06-21 MED ORDER — TRAZODONE HCL 50 MG PO TABS: 50.0000 mg | ORAL_TABLET | Freq: Every day | ORAL | 0 refills | Status: AC

## 2022-06-21 MED ORDER — LOSARTAN POTASSIUM 50 MG PO TABS: 50.0000 mg | ORAL_TABLET | Freq: Every day | ORAL | 0 refills | Status: DC

## 2022-06-21 MED ORDER — TRAZODONE HCL 50 MG PO TABS: 50.0000 mg | ORAL_TABLET | Freq: Every day | ORAL | 0 refills | Status: DC

## 2022-06-21 MED ORDER — LOSARTAN POTASSIUM 50 MG PO TABS: 50.0000 mg | ORAL_TABLET | Freq: Every day | ORAL | 1 refills | Status: AC

## 2022-06-21 MED ORDER — ASPIRIN 81 MG PO CHEW: 81.0000 mg | CHEWABLE_TABLET | Freq: Every day | ORAL | 0 refills | Status: DC

## 2022-06-21 NOTE — Progress Notes (Signed)
Physical Therapy Treatment Patient Details Name: Karen Bradford MRN: [id number] DOB: 04-12-1946 Today's Date: 06/21/2022   History of Present Illness Pt is a 76 y/o female admitted secondary to L facial droop and HTN. PMH includes a fib, HTN, fibromyalgia, R breast cancer, CLL, and anxiety.    PT Comments    Pt demonstrating good mobility progression today, ambulating hallway distance with supervision level of assist unless PT challenging pt's balance. Pt demonstrating min difficulty with head turns, weaving around objects, and stepping over objects in hallway when at baseline pt reports she would not have difficulty. Pt scored 16/24 on DGI outcome measure, demonstrating increased risk of falls. PT educated pt on increased unsteadiness vs baseline, as well as need for slow reintroduction to routine at home. PT encouraged multiple short bouts of gait throughout the day once home to build up tolerance, pt expresses understanding.     Recommendations for follow up therapy are one component of a multi-disciplinary discharge planning process, led by the attending physician.  Recommendations may be updated based on patient status, additional functional criteria and insurance authorization.  Follow Up Recommendations  Home health PT     Assistance Recommended at Discharge Intermittent Supervision/Assistance  Patient can return home with the following Assistance with cooking/housework   Equipment Recommendations  None recommended by PT    Recommendations for Other Services       Precautions / Restrictions Precautions Precautions: Fall Restrictions Weight Bearing Restrictions: No     Mobility  Bed Mobility Overal bed mobility: Modified Independent                  Transfers Overall transfer level: Modified independent                      Ambulation/Gait Ambulation/Gait assistance: Supervision, Min guard Gait Distance (Feet): 450 Feet Assistive device: None Gait  Pattern/deviations: Step-through pattern, Decreased stride length Gait velocity: decr     General Gait Details: supervision for safety, requiring close guard when PT challenging dynamic standing balance   Stairs Stairs: Yes Stairs assistance: Supervision Stair Management: One rail Right, Alternating pattern, Forwards Number of Stairs: 5     Wheelchair Mobility    Modified Rankin (Stroke Patients Only)       Balance Overall balance assessment: Needs assistance Sitting-balance support: No upper extremity supported, Feet supported Sitting balance-Leahy Scale: Fair     Standing balance support: No upper extremity supported Standing balance-Leahy Scale: Fair                   Standardized Balance Assessment Standardized Balance Assessment : Dynamic Gait Index   Dynamic Gait Index Level Surface: Normal Change in Gait Speed: Normal Gait with Horizontal Head Turns: Mild Impairment Gait with Vertical Head Turns: Mild Impairment Gait and Pivot Turn: Mild Impairment Step Over Obstacle: Moderate Impairment Step Around Obstacles: Moderate Impairment Steps: Mild Impairment Total Score: 16      Cognition Arousal/Alertness: Awake/alert Behavior During Therapy: WFL for tasks assessed/performed Overall Cognitive Status: Within Functional Limits for tasks assessed                                          Exercises      General Comments        Pertinent Vitals/Pain Pain Assessment Pain Assessment: No/denies pain    Home Living  Prior Function            PT Goals (current goals can now be found in the care plan section) Acute Rehab PT Goals Patient Stated Goal: to go home PT Goal Formulation: With patient Time For Goal Achievement: 07/02/22 Potential to Achieve Goals: Good Progress towards PT goals: Progressing toward goals    Frequency    Min 3X/week      PT Plan Current plan remains  appropriate    Co-evaluation              AM-PAC PT "6 Clicks" Mobility   Outcome Measure  Help needed turning from your back to your side while in a flat bed without using bedrails?: None Help needed moving from lying on your back to sitting on the side of a flat bed without using bedrails?: None Help needed moving to and from a bed to a chair (including a wheelchair)?: None Help needed standing up from a chair using your arms (e.g., wheelchair or bedside chair)?: None Help needed to walk in hospital room?: A Little Help needed climbing 3-5 steps with a railing? : A Little 6 Click Score: 22    End of Session   Activity Tolerance: Patient tolerated treatment well Patient left: in bed;with call bell/phone within reach Nurse Communication: Mobility status PT Visit Diagnosis: Unsteadiness on feet (R26.81);Muscle weakness (generalized) (M62.81);Difficulty in walking, not elsewhere classified (R26.2)     Time: 7564-3329 PT Time Calculation (min) (ACUTE ONLY): 14 min  Charges:  $Neuromuscular Re-education: 8-22 mins                     Stacie Glaze, PT DPT Acute Rehabilitation Services Pager [phone]  Office [phone]   Roxine Caddy E Ruffin Pyo 06/21/2022, 3:12 PM
